# Patient Record
Sex: Male | Born: 1950 | Race: White | Hispanic: No | Smoking: Former smoker
Health system: Southern US, Community
[De-identification: ages and names within clinical notes are randomized; demographics above are authoritative.]

## PROBLEM LIST (undated history)

## (undated) DIAGNOSIS — I2510 Atherosclerotic heart disease of native coronary artery without angina pectoris: Secondary | ICD-10-CM

## (undated) DIAGNOSIS — I10 Essential (primary) hypertension: Secondary | ICD-10-CM

## (undated) DIAGNOSIS — I219 Acute myocardial infarction, unspecified: Secondary | ICD-10-CM

## (undated) DIAGNOSIS — I714 Abdominal aortic aneurysm, without rupture, unspecified: Secondary | ICD-10-CM

## (undated) DIAGNOSIS — E7800 Pure hypercholesterolemia, unspecified: Secondary | ICD-10-CM

## (undated) HISTORY — PX: ABDOMINAL AORTIC ANEURYSM REPAIR: SUR1152

## (undated) HISTORY — PX: CORONARY STENT PLACEMENT: SHX1402

## (undated) HISTORY — PX: ABDOMINAL AORTIC ENDOVASCULAR STENT GRAFT: SHX5707

---

## 2016-01-28 ENCOUNTER — Observation Stay (HOSPITAL_BASED_OUTPATIENT_CLINIC_OR_DEPARTMENT_OTHER)
Admission: EM | Admit: 2016-01-28 | Discharge: 2016-01-30 | Disposition: A | Discharge status: Home / Self Care | Payer: 59 | Attending: Internal Medicine | Admitting: Internal Medicine

## 2016-01-28 ENCOUNTER — Emergency Department (HOSPITAL_BASED_OUTPATIENT_CLINIC_OR_DEPARTMENT_OTHER): Payer: 59

## 2016-01-28 ENCOUNTER — Encounter (HOSPITAL_BASED_OUTPATIENT_CLINIC_OR_DEPARTMENT_OTHER): Payer: Self-pay

## 2016-01-28 DIAGNOSIS — Z7982 Long term (current) use of aspirin: Secondary | ICD-10-CM | POA: Insufficient documentation

## 2016-01-28 DIAGNOSIS — Z87891 Personal history of nicotine dependence: Secondary | ICD-10-CM | POA: Insufficient documentation

## 2016-01-28 DIAGNOSIS — R0789 Other chest pain: Principal | ICD-10-CM | POA: Insufficient documentation

## 2016-01-28 DIAGNOSIS — Z955 Presence of coronary angioplasty implant and graft: Secondary | ICD-10-CM | POA: Insufficient documentation

## 2016-01-28 DIAGNOSIS — I10 Essential (primary) hypertension: Secondary | ICD-10-CM | POA: Diagnosis not present

## 2016-01-28 DIAGNOSIS — R079 Chest pain, unspecified: Secondary | ICD-10-CM | POA: Diagnosis present

## 2016-01-28 DIAGNOSIS — Z79899 Other long term (current) drug therapy: Secondary | ICD-10-CM | POA: Insufficient documentation

## 2016-01-28 DIAGNOSIS — G629 Polyneuropathy, unspecified: Secondary | ICD-10-CM | POA: Insufficient documentation

## 2016-01-28 DIAGNOSIS — I2583 Coronary atherosclerosis due to lipid rich plaque: Secondary | ICD-10-CM | POA: Insufficient documentation

## 2016-01-28 DIAGNOSIS — I2510 Atherosclerotic heart disease of native coronary artery without angina pectoris: Secondary | ICD-10-CM | POA: Diagnosis not present

## 2016-01-28 DIAGNOSIS — K219 Gastro-esophageal reflux disease without esophagitis: Secondary | ICD-10-CM | POA: Diagnosis not present

## 2016-01-28 DIAGNOSIS — I252 Old myocardial infarction: Secondary | ICD-10-CM | POA: Diagnosis not present

## 2016-01-28 DIAGNOSIS — E7800 Pure hypercholesterolemia, unspecified: Secondary | ICD-10-CM | POA: Diagnosis not present

## 2016-01-28 DIAGNOSIS — I200 Unstable angina: Secondary | ICD-10-CM

## 2016-01-28 DIAGNOSIS — I7102 Dissection of abdominal aorta: Secondary | ICD-10-CM

## 2016-01-28 DIAGNOSIS — I714 Abdominal aortic aneurysm, without rupture, unspecified: Secondary | ICD-10-CM

## 2016-01-28 DIAGNOSIS — Z7902 Long term (current) use of antithrombotics/antiplatelets: Secondary | ICD-10-CM | POA: Insufficient documentation

## 2016-01-28 HISTORY — DX: Acute myocardial infarction, unspecified: I21.9

## 2016-01-28 HISTORY — DX: Atherosclerotic heart disease of native coronary artery without angina pectoris: I25.10

## 2016-01-28 HISTORY — DX: Essential (primary) hypertension: I10

## 2016-01-28 HISTORY — DX: Abdominal aortic aneurysm, without rupture, unspecified: I71.40

## 2016-01-28 HISTORY — DX: Pure hypercholesterolemia, unspecified: E78.00

## 2016-01-28 HISTORY — DX: Abdominal aortic aneurysm, without rupture: I71.4

## 2016-01-28 LAB — BASIC METABOLIC PANEL
ANION GAP: 8 (ref 5–15)
BUN: 23 mg/dL — AB (ref 6–20)
CALCIUM: 10 mg/dL (ref 8.9–10.3)
CO2: 27 mmol/L (ref 22–32)
CREATININE: 1.3 mg/dL — AB (ref 0.61–1.24)
Chloride: 106 mmol/L (ref 101–111)
GFR calc Af Amer: >60 mL/min (ref >60)
GFR, EST NON AFRICAN AMERICAN: 57 mL/min — AB (ref >60)
GLUCOSE: 97 mg/dL (ref 65–99)
Potassium: 4.2 mmol/L (ref 3.5–5.1)
Sodium: 141 mmol/L (ref 135–145)

## 2016-01-28 LAB — CBC
HCT: 41.7 % (ref 39.0–52.0)
Hemoglobin: 14.1 g/dL (ref 13.0–17.0)
MCH: 29.5 pg (ref 26.0–34.0)
MCHC: 33.8 g/dL (ref 30.0–36.0)
MCV: 87.2 fL (ref 78.0–100.0)
PLATELETS: 254 10*3/uL (ref 150–400)
RBC: 4.78 MIL/uL (ref 4.22–5.81)
RDW: 13.6 % (ref 11.5–15.5)
WBC: 10.6 10*3/uL — ABNORMAL HIGH (ref 4.0–10.5)

## 2016-01-28 LAB — TROPONIN I

## 2016-01-28 MED ORDER — MORPHINE SULFATE (PF) 4 MG/ML IV SOLN
4.0000 mg | Freq: Once | INTRAVENOUS | Status: AC
  Administered 2016-01-28: 4 mg via INTRAVENOUS
  Filled 2016-01-28: qty 1

## 2016-01-28 MED ORDER — MORPHINE SULFATE (PF) 2 MG/ML IV SOLN
2.0000 mg | Freq: Once | INTRAVENOUS | Status: AC
  Administered 2016-01-28: 2 mg via INTRAVENOUS
  Filled 2016-01-28: qty 1

## 2016-01-28 MED ORDER — NITROGLYCERIN 0.4 MG SL SUBL
0.4000 mg | SUBLINGUAL_TABLET | SUBLINGUAL | Status: DC | PRN
  Administered 2016-01-28 (×2): 0.4 mg via SUBLINGUAL
  Filled 2016-01-28 (×3): qty 1

## 2016-01-28 MED ORDER — IOPAMIDOL (ISOVUE-370) INJECTION 76%
100.0000 mL | Freq: Once | INTRAVENOUS | Status: AC | PRN
  Administered 2016-01-28: 100 mL via INTRAVENOUS

## 2016-01-28 MED ORDER — ASPIRIN 81 MG PO CHEW
324.0000 mg | CHEWABLE_TABLET | Freq: Once | ORAL | Status: AC
Start: 2016-01-28 — End: 2016-01-28
  Administered 2016-01-28: 324 mg via ORAL
  Filled 2016-01-28: qty 4

## 2016-01-28 NOTE — ED Provider Notes (Signed)
[facility] Provider Note   CSN: [id number] Arrival date & time: 01/28/16  4098  First Provider Contact:  First MD Initiated Contact with Patient 01/28/16 1943     By signing my name below, I, [name]. [name], attest that this documentation has been prepared under the direction and in the presence of [name], MD.  Electronically Signed: [name]. [name], ED Scribe. 01/28/16. 8:17 PM.   History   Chief Complaint Chief Complaint  Patient presents with  . Chest Pain   The history is provided by the patient. No language interpreter was used.    HPI Comments: Ronald Mullins is a 65 y.o. male with a PMHx of AAA, CAD, HTN, and MI who presents to the Emergency Department complaining of sudden onset, constant, unchanged R sided chest pain with associated shortness of breath x 1 hour. Pain is described as pressure. Discomfort is exacerbated with movement without any alleviating factors. No OTC medications or home remedies attempted prior to arrival. No Nitro attempted after onset of pain. No recent fever, chills, nausea, or vomiting. PSHx includes abdominal aortic aneurysm repair, abdominal aortic endovascular stent graft, and coronary stent placement.  PCP: No primary care provider on file.    Past Medical History:  Diagnosis Date  . AAA (abdominal aortic aneurysm) without rupture (HCC)   . CAD (coronary artery disease), native coronary artery   . High cholesterol   . Hypertension   . Myocardial infarct (HCC)     There are no active problems to display for this patient.   Past Surgical History:  Procedure Laterality Date  . ABDOMINAL AORTIC ANEURYSM REPAIR    . ABDOMINAL AORTIC ENDOVASCULAR STENT GRAFT    . CORONARY STENT PLACEMENT         Home Medications    Prior to Admission medications   Medication Sig Start Date End Date Taking? Authorizing Provider  UNKNOWN TO PATIENT DOES NOT KNOW HOME MEDS   Yes Historical Provider, MD    Family History No family  history on file.  Social History Social History  Substance Use Topics  . Smoking status: Former Games developer  . Smokeless tobacco: Never Used  . Alcohol use No     Allergies   Review of patient's allergies indicates no known allergies.   Review of Systems Review of Systems  Constitutional: Negative for chills and fever.  Respiratory: Positive for shortness of breath. Negative for cough.   Cardiovascular: Positive for chest pain.  Gastrointestinal: Negative for nausea and vomiting.  Psychiatric/Behavioral: Negative for confusion.  All other systems reviewed and are negative.    Physical Exam Updated Vital Signs BP 169/88 (BP Location: Right Arm)   Pulse (!) 57   Temp 98.7 F (37.1 C) (Oral)   Resp 20   Ht  (1.803 m)   Wt 215 lb (97.5 kg)   SpO2 100%   BMI 29.99 kg/m   Physical Exam  Constitutional: He is oriented to person, place, and time. He appears well-developed and well-nourished. He appears distressed.  Pt appears slightly gray in appearance.  HENT:  Head: Normocephalic and atraumatic.  Eyes: EOM are normal.  Neck: Normal range of motion.  Cardiovascular: Normal rate, regular rhythm, normal heart sounds and intact distal pulses.  Exam reveals no gallop and no friction rub.   No murmur heard. Pulmonary/Chest: Effort normal and breath sounds normal. No respiratory distress. He has no wheezes. He has no rales.  Abdominal: Soft. He exhibits no distension. There is no tenderness.  Musculoskeletal: Normal range of motion.  Neurological: He is alert and oriented to person, place, and time.  Skin: Skin is warm and dry.  Psychiatric: He has a normal mood and affect. Judgment normal.  Nursing note and vitals reviewed.    ED Treatments / Results   DIAGNOSTIC STUDIES: Oxygen Saturation is 100% on RA, Normal by my interpretation.    COORDINATION OF CARE: 8:14 PM- Will order EKG, blood work, and imaging. Will give ASA and Nitro. Discussed treatment plan with pt  at bedside and pt agreed to plan.    Labs (all labs ordered are listed, but only abnormal results are displayed) Labs Reviewed  BASIC METABOLIC PANEL - Abnormal; Notable for the following:       Result Value   BUN 23 (*)    Creatinine, Ser 1.30 (*)    GFR calc non Af Amer 57 (*)    All other components within normal limits  CBC - Abnormal; Notable for the following:    WBC 10.6 (*)    All other components within normal limits  TROPONIN I  TROPONIN I    EKG  EKG Interpretation  Date/Time:  Thursday January 28 2016 20:33:32 EDT Ventricular Rate:  55 PR Interval:    QRS Duration: 100 QT Interval:  439 QTC Calculation: 420 R Axis:   66 Text Interpretation:  Sinus rhythm Normal ECG Confirmed by Corbet Hanley MD, Milliani Herrada [phone]) on 01/28/2016 9:31:36 PM       Radiology Dg Chest 2 View  Result Date: 01/28/2016 CLINICAL DATA:  65 year old male with severe right chest pain. Initial encounter. Former smoker. EXAM: CHEST  2 VIEW COMPARISON:  10/08/2015 and earlier. FINDINGS: Stable tortuosity of the thoracic aorta. Other mediastinal contours are within normal limits. Visualized tracheal air column is within normal limits. No pneumothorax, pulmonary edema, pleural effusion or acute pulmonary opacity. Mild basilar predominant increased interstitial markings are stable and probably related to smoking. Chronic right lateral sixth rib fracture unchanged. Chronic L2 compression fracture is stable since 2015. Partially visible abdominal aortic endograft. IMPRESSION: No acute cardiopulmonary abnormality. Electronically Signed   By: Odessa Fleming M.D.   On: 01/28/2016 20:21   Ct Angio Chest/abd/pel For Dissection W And/or W/wo  Result Date: 01/28/2016 CLINICAL DATA:  65 year old male right chest pain.  History of AAA. EXAM: CT ANGIOGRAPHY CHEST, ABDOMEN AND PELVIS TECHNIQUE: Multidetector CT imaging through the chest, abdomen and pelvis was performed using the standard protocol during bolus administration of  intravenous contrast. Multiplanar reconstructed images and MIPs were obtained and reviewed to evaluate the vascular anatomy. CONTRAST:  100 cc Isovue 370 COMPARISON:  Chest radiograph dated 01/28/2016 and CT of the abdomen pelvis dated 06/22/2014 FINDINGS: CTA CHEST FINDINGS There are minimal bibasilar dependent atelectatic changes of the lungs. There is no focal consolidation, pleural effusion, or pneumothorax. The central airways are patent. There is mild atherosclerotic calcification of the thoracic aorta. There is no dissection. The aortic isthmus measures approximately 3.4 cm in diameter. There is focal wall thickening of the proximal aspect of the left subclavian artery with high grade focal stenosis. The origins of the great vessels of the aortic arch are otherwise patent. There is no CT evidence of pulmonary embolism. There is no cardiomegaly or pericardial effusion. There is coronary vascular calcification. Calcified right hilar and mediastinal lymph nodes. There is no adenopathy. The esophagus is grossly unremarkable. No thyroid nodules identified. There is no axillary adenopathy. The chest wall soft tissues appear unremarkable. Multiple right posterior rib old healed  rib fractures. There are multilevel degenerative changes of the spine. No acute fracture. Review of the MIP images confirms the above findings. CTA ABDOMEN AND PELVIS FINDINGS No intra-abdominal free air or free fluid. There is mild irregularity of the hepatic contour may represent mild underlying cirrhosis. Clinical correlation is recommended. Small gallstones may be present. There is fluid there is fatty infiltration of the uncinate process and proximal pancreas. The spleen, adrenal glands, appear unremarkable. There is no hydronephrosis on either side. Stable appearing bilateral renal cysts measuring up to 3.6 cm on the right. The visualized ureters and urinary bladder appear unremarkable. The prostate and seminal vesicles are grossly  unremarkable. There is sigmoid diverticulosis without active inflammatory changes. There is moderate stool throughout the colon. No evidence of bowel obstruction or active inflammation. Normal appendix. There is stable appearing noncalcified plaque versus scarring along the posterior wall of the abdominal aorta superior to the level of the renal arteries. An aorto bi-iliac endovascular stent graft repair is noted and appears patent. There is no extravasation of contrast outside of the confines of the stent graft to suggest a leak. The origins of the celiac axis, SMA, IMA as well as the origins of the renal arteries appear patent. There is a classic celiac axis branching anatomy. No portal venous gas identified. There is no adenopathy. The abdominal wall soft tissues appear unremarkable. There is osteopenia with degenerative changes of the spine. There is a stable appearing L2 compression deformity. No acute fracture. Review of the MIP images confirms the above findings. IMPRESSION: No CT evidence of aortic dissection or pulmonary embolism. Mildly dilated aortic isthmus measuring 3.4 cm in diameter. Focal high-grade narrowing of the first proximal left subclavian artery. This has progressed compared to the CT dated 08/16/2013. Infrarenal aortobifemoral stent graft repair appears patent with no evidence of endoleak. Electronically Signed   By: Elgie Collard M.D.   On: 01/28/2016 21:33    Procedures Procedures (including critical care time)  Medications Ordered in ED Medications  nitroGLYCERIN (NITROSTAT) SL tablet 0.4 mg (0.4 mg Sublingual Given 01/28/16 2026)  aspirin chewable tablet 324 mg (324 mg Oral Given 01/28/16 2017)  iopamidol (ISOVUE-370) 76 % injection 100 mL (100 mLs Intravenous Contrast Given 01/28/16 2057)  morphine 2 MG/ML injection 2 mg (2 mg Intravenous Given 01/28/16 2238)  morphine 4 MG/ML injection 4 mg (4 mg Intravenous Given 01/28/16 2322)     Initial Impression / Assessment and Plan /  ED Course  I have reviewed the triage vital signs and the nursing notes.  Pertinent labs & imaging results that were available during my care of the patient were reviewed by me and considered in my medical decision making (see chart for details).  Clinical Course    65 year old male with history of coronary artery disease and remote AAA repair presents with right-sided and central chest pressure that radiates into the right side of his chest. He became short of breath with the onset of symptoms. EKG on arrival shows no evidence of active ischemia. First troponin is negative. Given his fall for risk factors and prior aortic pathology a CT of the chest and abdomen was ordered to rule out dissection which is negative. The patient is very high risk for ACS and I am unable to rule out from the emergency department. I recommended admission.  The patient's primary cardiologist is at [facility], I spoke with the hospitalist at [facility] who tells me they have no available beds.  Hospitalist at [facility] Dr  Danford was consulted for admission and accepted the patient in transfer to facility capable of caring for patient further.   Final Clinical Impressions(s) / ED Diagnoses   Final diagnoses:  Chest pain, unspecified chest pain type   I personally performed the services described in this documentation, which was scribed in my presence. The recorded information has been reviewed and is accurate.     New Prescriptions New Prescriptions   No medications on file     [name], MD 01/28/16 [phone]

## 2016-01-28 NOTE — ED Notes (Signed)
Pt transported to CT ?

## 2016-01-28 NOTE — ED Triage Notes (Signed)
CP x 1 hour-cool, clammy to touch-taken to tx area via w/c

## 2016-01-28 NOTE — ED Notes (Signed)
SL Nitro provided little to no relief of pain. Dr. Clydene Pugh notified of pt status and continued pain, but provided no new orders.

## 2016-01-28 NOTE — Progress Notes (Signed)
65 yo M with PVD, CAD and HTN who presents with chest pain.  Sounds typical in character.  Followed by Cardiology [place] at [facility].  Got aspirin, nitro, morphine, no improvement.  Ruled out for dissection with CTA which was negative.  First troponin negative and ECG just sinus bradycardia (no previous available) so no heparin started.  Will go to Stepdown OBS status given ongoing pain.  Serial enzymes are ordered and tele.

## 2016-01-29 ENCOUNTER — Observation Stay (HOSPITAL_COMMUNITY): Payer: 59

## 2016-01-29 ENCOUNTER — Encounter (HOSPITAL_COMMUNITY): Payer: Self-pay | Admitting: Family Medicine

## 2016-01-29 ENCOUNTER — Encounter (HOSPITAL_COMMUNITY)
Admission: EM | Disposition: A | Discharge status: Home / Self Care | Payer: Self-pay | Source: Home / Self Care | Attending: Internal Medicine

## 2016-01-29 DIAGNOSIS — I10 Essential (primary) hypertension: Secondary | ICD-10-CM | POA: Diagnosis not present

## 2016-01-29 DIAGNOSIS — I714 Abdominal aortic aneurysm, without rupture, unspecified: Secondary | ICD-10-CM

## 2016-01-29 DIAGNOSIS — I25110 Atherosclerotic heart disease of native coronary artery with unstable angina pectoris: Secondary | ICD-10-CM

## 2016-01-29 DIAGNOSIS — I209 Angina pectoris, unspecified: Secondary | ICD-10-CM | POA: Diagnosis not present

## 2016-01-29 DIAGNOSIS — I708 Atherosclerosis of other arteries: Secondary | ICD-10-CM | POA: Diagnosis not present

## 2016-01-29 DIAGNOSIS — I2583 Coronary atherosclerosis due to lipid rich plaque: Secondary | ICD-10-CM

## 2016-01-29 DIAGNOSIS — R079 Chest pain, unspecified: Secondary | ICD-10-CM | POA: Diagnosis not present

## 2016-01-29 DIAGNOSIS — I7102 Dissection of abdominal aorta: Secondary | ICD-10-CM

## 2016-01-29 DIAGNOSIS — I2510 Atherosclerotic heart disease of native coronary artery without angina pectoris: Secondary | ICD-10-CM | POA: Diagnosis present

## 2016-01-29 DIAGNOSIS — I200 Unstable angina: Secondary | ICD-10-CM

## 2016-01-29 HISTORY — PX: CARDIAC CATHETERIZATION: SHX172

## 2016-01-29 LAB — BASIC METABOLIC PANEL
ANION GAP: 8 (ref 5–15)
BUN: 16 mg/dL (ref 6–20)
CALCIUM: 9.6 mg/dL (ref 8.9–10.3)
CHLORIDE: 102 mmol/L (ref 101–111)
CO2: 27 mmol/L (ref 22–32)
Creatinine, Ser: 1.18 mg/dL (ref 0.61–1.24)
GFR calc non Af Amer: >60 mL/min (ref >60)
GLUCOSE: 92 mg/dL (ref 65–99)
POTASSIUM: 4 mmol/L (ref 3.5–5.1)
Sodium: 137 mmol/L (ref 135–145)

## 2016-01-29 LAB — PROTIME-INR
INR: 1.17
Prothrombin Time: 15 seconds (ref 11.4–15.2)

## 2016-01-29 LAB — HEPATIC FUNCTION PANEL
ALBUMIN: 3.9 g/dL (ref 3.5–5.0)
ALK PHOS: 47 U/L (ref 38–126)
ALT: 17 U/L (ref 17–63)
AST: 21 U/L (ref 15–41)
BILIRUBIN INDIRECT: 0.4 mg/dL (ref 0.3–0.9)
Bilirubin, Direct: 0.1 mg/dL (ref 0.1–0.5)
TOTAL PROTEIN: 6.4 g/dL — AB (ref 6.5–8.1)
Total Bilirubin: 0.5 mg/dL (ref 0.3–1.2)

## 2016-01-29 LAB — LIPASE, BLOOD: Lipase: 25 U/L (ref 11–51)

## 2016-01-29 LAB — TROPONIN I: Troponin I: <0.03 ng/mL (ref <0.03)

## 2016-01-29 SURGERY — LEFT HEART CATH AND CORONARY ANGIOGRAPHY
Anesthesia: LOCAL

## 2016-01-29 MED ORDER — SODIUM CHLORIDE 0.9% FLUSH
3.0000 mL | Freq: Two times a day (BID) | INTRAVENOUS | Status: DC
  Administered 2016-01-29: 3 mL via INTRAVENOUS

## 2016-01-29 MED ORDER — LISINOPRIL 5 MG PO TABS
5.0000 mg | ORAL_TABLET | Freq: Every day | ORAL | Status: DC
  Administered 2016-01-29 – 2016-01-30 (×2): 5 mg via ORAL
  Filled 2016-01-29 (×2): qty 1

## 2016-01-29 MED ORDER — IOPAMIDOL (ISOVUE-370) INJECTION 76%
INTRAVENOUS | Status: AC
  Filled 2016-01-29: qty 100

## 2016-01-29 MED ORDER — MORPHINE SULFATE (PF) 2 MG/ML IV SOLN: 2.0000 mg | INTRAVENOUS | Status: DC | PRN

## 2016-01-29 MED ORDER — HEPARIN SODIUM (PORCINE) 1000 UNIT/ML IJ SOLN
INTRAMUSCULAR | Status: DC | PRN
  Administered 2016-01-29: 5000 [IU] via INTRAVENOUS

## 2016-01-29 MED ORDER — MORPHINE SULFATE (PF) 4 MG/ML IV SOLN
4.0000 mg | Freq: Once | INTRAVENOUS | Status: AC
  Administered 2016-01-29: 4 mg via INTRAVENOUS

## 2016-01-29 MED ORDER — SODIUM CHLORIDE 0.9% FLUSH
3.0000 mL | Freq: Two times a day (BID) | INTRAVENOUS | Status: DC
  Administered 2016-01-30: 3 mL via INTRAVENOUS

## 2016-01-29 MED ORDER — HEPARIN (PORCINE) IN NACL 2-0.9 UNIT/ML-% IJ SOLN
INTRAMUSCULAR | Status: DC | PRN
  Administered 2016-01-29: 1000 mL

## 2016-01-29 MED ORDER — SODIUM CHLORIDE 0.9 % IV SOLN: 250.0000 mL | INTRAVENOUS | Status: DC | PRN

## 2016-01-29 MED ORDER — FENTANYL CITRATE (PF) 100 MCG/2ML IJ SOLN
INTRAMUSCULAR | Status: DC | PRN
  Administered 2016-01-29: 50 ug via INTRAVENOUS

## 2016-01-29 MED ORDER — ACETAMINOPHEN 325 MG PO TABS
650.0000 mg | ORAL_TABLET | ORAL | Status: DC | PRN
Start: 2016-01-29 — End: 2016-01-30

## 2016-01-29 MED ORDER — GABAPENTIN 300 MG PO CAPS
300.0000 mg | ORAL_CAPSULE | Freq: Two times a day (BID) | ORAL | Status: DC
  Administered 2016-01-29 – 2016-01-30 (×3): 300 mg via ORAL
  Filled 2016-01-29 (×3): qty 1

## 2016-01-29 MED ORDER — MORPHINE SULFATE (PF) 4 MG/ML IV SOLN
INTRAVENOUS | Status: AC
  Filled 2016-01-29: qty 1

## 2016-01-29 MED ORDER — CARVEDILOL 3.125 MG PO TABS
3.1250 mg | ORAL_TABLET | Freq: Two times a day (BID) | ORAL | Status: DC
  Administered 2016-01-29 – 2016-01-30 (×2): 3.125 mg via ORAL
  Filled 2016-01-29 (×3): qty 1

## 2016-01-29 MED ORDER — VERAPAMIL HCL 2.5 MG/ML IV SOLN
INTRAVENOUS | Status: DC | PRN
  Administered 2016-01-29: 10 mL via INTRA_ARTERIAL

## 2016-01-29 MED ORDER — SODIUM CHLORIDE 0.9% FLUSH: 3.0000 mL | INTRAVENOUS | Status: DC | PRN

## 2016-01-29 MED ORDER — LIDOCAINE HCL (PF) 1 % IJ SOLN
INTRAMUSCULAR | Status: AC
  Filled 2016-01-29: qty 30

## 2016-01-29 MED ORDER — MIDAZOLAM HCL 2 MG/2ML IJ SOLN
INTRAMUSCULAR | Status: AC
  Filled 2016-01-29: qty 2

## 2016-01-29 MED ORDER — ONDANSETRON HCL 4 MG/2ML IJ SOLN: 4.0000 mg | Freq: Four times a day (QID) | INTRAMUSCULAR | Status: DC | PRN

## 2016-01-29 MED ORDER — ASPIRIN EC 81 MG PO TBEC
81.0000 mg | DELAYED_RELEASE_TABLET | Freq: Every day | ORAL | Status: DC
  Administered 2016-01-29 – 2016-01-30 (×2): 81 mg via ORAL
  Filled 2016-01-29 (×2): qty 1

## 2016-01-29 MED ORDER — LIDOCAINE HCL (PF) 1 % IJ SOLN
INTRAMUSCULAR | Status: DC | PRN
  Administered 2016-01-29: 2 mL via INTRADERMAL

## 2016-01-29 MED ORDER — MIDAZOLAM HCL 2 MG/2ML IJ SOLN
INTRAMUSCULAR | Status: DC | PRN
  Administered 2016-01-29: 1 mg via INTRAVENOUS

## 2016-01-29 MED ORDER — ENOXAPARIN SODIUM 40 MG/0.4ML ~~LOC~~ SOLN
40.0000 mg | SUBCUTANEOUS | Status: DC
  Administered 2016-01-29: 40 mg via SUBCUTANEOUS
  Filled 2016-01-29 (×2): qty 0.4

## 2016-01-29 MED ORDER — GI COCKTAIL ~~LOC~~: 30.0000 mL | Freq: Four times a day (QID) | ORAL | Status: DC | PRN

## 2016-01-29 MED ORDER — HEPARIN (PORCINE) IN NACL 2-0.9 UNIT/ML-% IJ SOLN
INTRAMUSCULAR | Status: AC
  Filled 2016-01-29: qty 1000

## 2016-01-29 MED ORDER — SODIUM CHLORIDE 0.9 % WEIGHT BASED INFUSION: 1.0000 mL/kg/h | INTRAVENOUS | Status: AC

## 2016-01-29 MED ORDER — HEPARIN SODIUM (PORCINE) 1000 UNIT/ML IJ SOLN
INTRAMUSCULAR | Status: AC
  Filled 2016-01-29: qty 1

## 2016-01-29 MED ORDER — ATORVASTATIN CALCIUM 80 MG PO TABS
80.0000 mg | ORAL_TABLET | Freq: Every day | ORAL | Status: DC
  Administered 2016-01-29: 80 mg via ORAL
  Filled 2016-01-29: qty 1

## 2016-01-29 MED ORDER — ACETAMINOPHEN 325 MG PO TABS: 650.0000 mg | ORAL_TABLET | ORAL | Status: DC | PRN

## 2016-01-29 MED ORDER — IOPAMIDOL (ISOVUE-370) INJECTION 76%
INTRAVENOUS | Status: DC | PRN
  Administered 2016-01-29: 70 mL via INTRA_ARTERIAL

## 2016-01-29 MED ORDER — ASPIRIN 81 MG PO CHEW: 81.0000 mg | CHEWABLE_TABLET | ORAL | Status: DC

## 2016-01-29 MED ORDER — PANTOPRAZOLE SODIUM 40 MG PO TBEC
40.0000 mg | DELAYED_RELEASE_TABLET | Freq: Every day | ORAL | Status: DC
  Administered 2016-01-29 – 2016-01-30 (×2): 40 mg via ORAL
  Filled 2016-01-29 (×2): qty 1

## 2016-01-29 MED ORDER — FENTANYL CITRATE (PF) 100 MCG/2ML IJ SOLN
INTRAMUSCULAR | Status: AC
  Filled 2016-01-29: qty 2

## 2016-01-29 MED ORDER — PRASUGREL HCL 10 MG PO TABS
10.0000 mg | ORAL_TABLET | Freq: Every day | ORAL | Status: DC
  Administered 2016-01-29 – 2016-01-30 (×2): 10 mg via ORAL
  Filled 2016-01-29 (×2): qty 1

## 2016-01-29 MED ORDER — SODIUM CHLORIDE 0.9 % IV SOLN
INTRAVENOUS | Status: DC
  Administered 2016-01-29: 13:00:00 via INTRAVENOUS

## 2016-01-29 SURGICAL SUPPLY — 9 items
CATH INFINITI 5 FR JL3.5 (CATHETERS) ×2 IMPLANT
CATH INFINITI JR4 5F (CATHETERS) ×2 IMPLANT
DEVICE RAD COMP TR BAND LRG (VASCULAR PRODUCTS) ×2 IMPLANT
GLIDESHEATH SLEND SS 6F .021 (SHEATH) ×2 IMPLANT
KIT HEART LEFT (KITS) ×2 IMPLANT
PACK CARDIAC CATHETERIZATION (CUSTOM PROCEDURE TRAY) ×2 IMPLANT
TRANSDUCER W/STOPCOCK (MISCELLANEOUS) ×2 IMPLANT
TUBING CIL FLEX 10 FLL-RA (TUBING) ×2 IMPLANT
WIRE SAFE-T 1.5MM-J .035X260CM (WIRE) ×2 IMPLANT

## 2016-01-29 NOTE — Progress Notes (Signed)
Pt back of floor from cath lab, VSS, right radial site level 0, alert and oriented, tolerating liquids, will continue to monitor closely.  Raymon Mutton RN

## 2016-01-29 NOTE — Progress Notes (Signed)
[facility]                                                                              Patient Demographics  Ronald Mullins, is a 65 y.o. male, DOB - [birth year], NWG:[id number]  Admit date - 01/28/2016   Admitting Physician Alberteen Sam, MD  Outpatient Primary MD for the patient is Everrett Coombe, DO  Outpatient specialists:   LOS - 1  days    Chief Complaint  Patient presents with  . Chest Pain       Brief summary  Ronald Mullins is a 65 y.o. male with a past medical history significant for AAA s/p rupture in 2015 and endo-repair, STEMI in Oct 2016 s/p DES (to LCx-OM1 bifurcation) and then staged DES to residual D1 in 09/2015 who presents with chest pain. 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  This was vague and pressure-like and similar to his previous angina, and associated with some panicking, sweating.  He sat down and it didn't go away, and then he had onset of sharp, severe, brief shooting/stabbing pains in his right chest like his aneurysm rupture and so he went to the ER. ED course: -Afebrile, hemodynamically stable, hypertensive -Initial ECG showed normal sinus rhythm without ST changes and troponin was negative. -Na 141, K 4.2, Cr 1.3 (baseline 1.2), WBC 10.6, Hgb 14.1 -A CTA chest abdomen and pelvis dissection study was done that showed no change in his chronic endovascularly repaired aneurysm, no gallbladder disease, no pancreatic disease, no PE, pneumothorax or pneumonia -He was given morphine and nitro and his pain eased slightly and [facility] were asked to evaluate for admission because [facility] where his primary cardiologist is was full -[facility] was asked to admit for observation, serial troponins and risk stratification. The pain started abruptly, was not  food related or related to movement.  No recent heavy lifting or arm movement.    Assessment & Plan    Chest pain:With strong history of coronary artery disease, per patient PCI in October 2016 and in April 2017 - Cardiology consulted, recommending cardiac cath  - Serial cardiac enzymes negative so far  - Continue aspirin, Effient, statin, beta blocker, lisinopril - Cardiac enzymes negative so far, EKG with no acute ST-T wave changes suggestive of ischemia - CT angiogram of the chest/abdomen showed no aortic dissection or PE  CAD:  -Continue aspirin, Effient -Continue statin -Continue BB and ACEi  Neuropathy:  -Continue gabapentin  Code Status: full code DVT Prophylaxis:  Lovenox Family Communication: Discussed in detail with the patient, all imaging results, lab results explained to the patient  Disposition Plan:   Time Spent in minutes    Procedures:  CTAchest abdomen  Consultants:   Cardiology  Antimicrobials:      Medications  Scheduled Meds: . aspirin EC  81 mg Oral Daily  . atorvastatin  80 mg Oral q1800  . carvedilol  3.125 mg Oral BID WC  .  enoxaparin (LOVENOX) injection  40 mg Subcutaneous Q24H  . gabapentin  300 mg Oral BID  . lisinopril  5 mg Oral Daily  . pantoprazole  40 mg Oral Q0600  . prasugrel  10 mg Oral Daily   Continuous Infusions:  PRN Meds:.acetaminophen, gi cocktail, morphine injection, nitroGLYCERIN, ondansetron (ZOFRAN) IV   Antibiotics   Anti-infectives    None        Subjective:   Ronald Mullins was seen and examined today. Still complaining of chest pain 5/10 intermittent, left-sided. No radiation, no left arm heaviness or numbness or tingling or palpitations or dyspnea. No fevers.  Objective:   Vitals:   01/29/16 0300 01/29/16 0305 01/29/16 0330 01/29/16 0421  BP: 143/72  147/75 (!) 157/87  Pulse: (!) 55  63 (!) 53  Resp: 13  16 17   Temp:  98.1 F (36.7 C)  98.2 F (36.8 C)  TempSrc:  Oral  Oral    SpO2: 96%  99% 97%  Weight:    95.4 kg (210 lb 4.8 oz)  Height:    5\' 11"  (1.803 m)    Intake/Output Summary (Last 24 hours) at 01/29/16 1140 Last data filed at 01/29/16 0906  Gross per 24 hour  Intake                0 ml  Output                0 ml  Net                0 ml     Wt Readings from Last 3 Encounters:  01/29/16 95.4 kg (210 lb 4.8 oz)     Exam  General: Alert and oriented x 3, NAD  HEENT:  PERRLA, EOMI, Anicteric Sclera, mucous membranes moist.   Neck: Supple, no JVD, no masses  Cardiovascular: S1 S2 auscultated, no rubs, murmurs or gallops. Regular rate and rhythm.  Respiratory: Clear to auscultation bilaterally, no wheezing, rales or rhonchi, no chest wall tenderness  Gastrointestinal: Soft, nontender, nondistended, + bowel sounds  Ext: no cyanosis clubbing or edema  Neuro: AAOx3, Cr N's II- XII. Strength 5/5 upper and lower extremities bilaterally  Skin: No rashes  Psych: Normal affect and demeanor, alert and oriented x3    Data Reviewed:  I have personally reviewed following labs and imaging studies  Micro Results No results found for this or any previous visit (from the past 240 hour(s)).  Radiology Reports Dg Chest 2 View  Result Date: 01/28/2016 CLINICAL DATA:  65 year old male with severe right chest pain. Initial encounter. Former smoker. EXAM: CHEST  2 VIEW COMPARISON:  10/08/2015 and earlier. FINDINGS: Stable tortuosity of the thoracic aorta. Other mediastinal contours are within normal limits. Visualized tracheal air column is within normal limits. No pneumothorax, pulmonary edema, pleural effusion or acute pulmonary opacity. Mild basilar predominant increased interstitial markings are stable and probably related to smoking. Chronic right lateral sixth rib fracture unchanged. Chronic L2 compression fracture is stable since 2015. Partially visible abdominal aortic endograft. IMPRESSION: No acute cardiopulmonary abnormality. Electronically  Signed   By: Odessa Fleming M.D.   On: 01/28/2016 20:21   Ct Angio Chest/abd/pel For Dissection W And/or W/wo  Result Date: 01/28/2016 CLINICAL DATA:  65 year old male right chest pain.  History of AAA. EXAM: CT ANGIOGRAPHY CHEST, ABDOMEN AND PELVIS TECHNIQUE: Multidetector CT imaging through the chest, abdomen and pelvis was performed using the standard protocol during bolus administration of intravenous contrast. Multiplanar reconstructed images and MIPs  were obtained and reviewed to evaluate the vascular anatomy. CONTRAST:  100 cc Isovue 370 COMPARISON:  Chest radiograph dated 01/28/2016 and CT of the abdomen pelvis dated 06/22/2014 FINDINGS: CTA CHEST FINDINGS There are minimal bibasilar dependent atelectatic changes of the lungs. There is no focal consolidation, pleural effusion, or pneumothorax. The central airways are patent. There is mild atherosclerotic calcification of the thoracic aorta. There is no dissection. The aortic isthmus measures approximately 3.4 cm in diameter. There is focal wall thickening of the proximal aspect of the left subclavian artery with high grade focal stenosis. The origins of the great vessels of the aortic arch are otherwise patent. There is no CT evidence of pulmonary embolism. There is no cardiomegaly or pericardial effusion. There is coronary vascular calcification. Calcified right hilar and mediastinal lymph nodes. There is no adenopathy. The esophagus is grossly unremarkable. No thyroid nodules identified. There is no axillary adenopathy. The chest wall soft tissues appear unremarkable. Multiple right posterior rib old healed rib fractures. There are multilevel degenerative changes of the spine. No acute fracture. Review of the MIP images confirms the above findings. CTA ABDOMEN AND PELVIS FINDINGS No intra-abdominal free air or free fluid. There is mild irregularity of the hepatic contour may represent mild underlying cirrhosis. Clinical correlation is recommended. Small  gallstones may be present. There is fluid there is fatty infiltration of the uncinate process and proximal pancreas. The spleen, adrenal glands, appear unremarkable. There is no hydronephrosis on either side. Stable appearing bilateral renal cysts measuring up to 3.6 cm on the right. The visualized ureters and urinary bladder appear unremarkable. The prostate and seminal vesicles are grossly unremarkable. There is sigmoid diverticulosis without active inflammatory changes. There is moderate stool throughout the colon. No evidence of bowel obstruction or active inflammation. Normal appendix. There is stable appearing noncalcified plaque versus scarring along the posterior wall of the abdominal aorta superior to the level of the renal arteries. An aorto bi-iliac endovascular stent graft repair is noted and appears patent. There is no extravasation of contrast outside of the confines of the stent graft to suggest a leak. The origins of the celiac axis, SMA, IMA as well as the origins of the renal arteries appear patent. There is a classic celiac axis branching anatomy. No portal venous gas identified. There is no adenopathy. The abdominal wall soft tissues appear unremarkable. There is osteopenia with degenerative changes of the spine. There is a stable appearing L2 compression deformity. No acute fracture. Review of the MIP images confirms the above findings. IMPRESSION: No CT evidence of aortic dissection or pulmonary embolism. Mildly dilated aortic isthmus measuring 3.4 cm in diameter. Focal high-grade narrowing of the first proximal left subclavian artery. This has progressed compared to the CT dated 08/16/2013. Infrarenal aortobifemoral stent graft repair appears patent with no evidence of endoleak. Electronically Signed   By: Elgie Collard M.D.   On: 01/28/2016 21:33    Lab Data:  CBC:  Recent Labs Lab 01/28/16 1935  WBC 10.6*  HGB 14.1  HCT 41.7  MCV 87.2  PLT 254   Basic Metabolic  Panel:  Recent Labs Lab 01/28/16 1935  NA 141  K 4.2  CL 106  CO2 27  GLUCOSE 97  BUN 23*  CREATININE 1.30*  CALCIUM 10.0   GFR: Estimated Creatinine Clearance: 68.5 mL/min (by C-G formula based on SCr of 1.3 mg/dL). Liver Function Tests:  Recent Labs Lab 01/29/16 0546  AST 21  ALT 17  ALKPHOS 47  BILITOT 0.5  PROT 6.4*  ALBUMIN 3.9    Recent Labs Lab 01/29/16 0546  LIPASE 25   No results for input(s): AMMONIA in the last 168 hours. Coagulation Profile: No results for input(s): INR, PROTIME in the last 168 hours. Cardiac Enzymes:  Recent Labs Lab 01/28/16 1935 01/28/16 2345 01/29/16 0546  TROPONINI <0.03 <0.03 <0.03   BNP (last 3 results) No results for input(s): PROBNP in the last 8760 hours. HbA1C: No results for input(s): HGBA1C in the last 72 hours. CBG: No results for input(s): GLUCAP in the last 168 hours. Lipid Profile: No results for input(s): CHOL, HDL, LDLCALC, TRIG, CHOLHDL, LDLDIRECT in the last 72 hours. Thyroid Function Tests: No results for input(s): TSH, T4TOTAL, FREET4, T3FREE, THYROIDAB in the last 72 hours. Anemia Panel: No results for input(s): VITAMINB12, FOLATE, FERRITIN, TIBC, IRON, RETICCTPCT in the last 72 hours. Urine analysis: No results found for: COLORURINE, APPEARANCEUR, LABSPEC, PHURINE, GLUCOSEU, HGBUR, BILIRUBINUR, KETONESUR, PROTEINUR, UROBILINOGEN, NITRITE, Hurshel Party M.D. [facility] 01/29/2016, 11:40 AM  Pager: [phone] Between 7am to 7pm - call Pager - [phone]  After 7pm go to [email or web address] - password [email or web address]  Call night coverage person covering after 7pm

## 2016-01-29 NOTE — H&P (Signed)
History and Physical  Patient Name: Ronald Mullins     SMO:[id number]    DOB: 1951-02-09    DOA: 01/28/2016 PCP: Everrett Coombe, DO   Patient coming from: Home     Chief Complaint: Chest pain  HPI: Ronald Mullins is a 65 y.o. male with a past medical history significant for AAA s/p rupture in 2015 and endo-repair, STEMI in Oct 2016 s/p DES (to LCx-OM1 bifurcation) and then staged DES to residual D1 in 09/2015 who presents with chest pain.  The patient has been pain-free since his STEMI last October until tonight he was on the baseball field (he is president of a baseball league in [facility]) and had sudden onset of substernal chest discomfort while just standing there.  This was vague and pressure-like and similar to his previous angina, and associated with some panicking, sweating.  He sat down and it didn't go away, and then he had onset of sharp, severe, brief shooting/stabbing pains in his right chest like his aneurysm rupture and so he went to the ER.  ED course: -Afebrile, hemodynamically stable, hypertensive -Initial ECG showed normal sinus rhythm without ST changes and troponin was negative. -Na 141, K 4.2, Cr 1.3 (baseline 1.2), WBC 10.6, Hgb 14.1 -A CTA chest abdomen and pelvis dissection study was done that showed no change in his chronic endovascularly repaired aneurysm, no gallbladder disease, no pancreatic disease, no PE, pneumothorax or pneumonia -He was given morphine and nitro and his pain eased slightly and [facility] were asked to evaluate for admission because [facility] where his primary cardiologist is was full -TRH was asked to admit for observation, serial troponins and risk stratification.  The pain started abruptly, was not food related or related to movement.  No recent heavy lifting or arm movement.     Review of Systems:  All other systems negative except as just noted or noted in the history of present illness.  Past Medical History:  Diagnosis Date  . AAA  (abdominal aortic aneurysm) without rupture (HCC)   . CAD (coronary artery disease), native coronary artery   . High cholesterol   . Hypertension   . Myocardial infarct [facility])     Past Surgical History:  Procedure Laterality Date  . ABDOMINAL AORTIC ANEURYSM REPAIR    . ABDOMINAL AORTIC ENDOVASCULAR STENT GRAFT    . CORONARY STENT PLACEMENT      Social History: Patient lives with his son.  Patient walks unassisted.  He is a remote former smoker.  He does not drink.  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    No Known Allergies  Family history: family history includes Celiac disease in his sister; Diabetes in his mother; Hypertension in his father and mother.  Prior to Admission medications   Medication Sig Start Date End Date Taking? Authorizing Provider  aspirin 81 MG tablet Take 81 mg by mouth daily.   Yes Historical Provider, MD  atorvastatin (LIPITOR) 80 MG tablet Take 80 mg by mouth daily at 6 PM.   Yes Historical Provider, MD  carvedilol (COREG) 3.125 MG tablet Take 3.125 mg by mouth 2 (two) times daily with a meal.   Yes Historical Provider, MD  gabapentin (NEURONTIN) 300 MG capsule Take 300 mg by mouth 2 (two) times daily.   Yes Historical Provider, MD  lisinopril (PRINIVIL,ZESTRIL) 5 MG tablet Take 5 mg by mouth daily.   Yes Historical Provider, MD  Prasugrel HCl (EFFIENT PO) Take 10 mg by mouth daily.  Yes Historical Provider, MD       Physical Exam: BP (!) 157/87 (BP Location: Left Arm)   Pulse (!) 53   Temp 98.2 F (36.8 C) (Oral)   Resp 17   Ht  (1.803 m)   Wt 95.4 kg (210 lb 4.8 oz)   SpO2 97%   BMI 29.33 kg/m  General appearance: Well-developed, adult male, alert and in no acute distress.   Eyes: Anicteric, conjunctiva pink, lids and lashes normal.     ENT: No nasal deformity, discharge, or epistaxis.  OP moist without lesions.   Skin: Warm and dry.   Cardiac: RRR, nl S1-S2, no murmurs appreciated.  Capillary refill is brisk.  JVP normal.  No  LE edema.  Radial pulses 2+ and symmetric.  DP pulses normal.  No carotid bruits. Respiratory: Normal respiratory rate and rhythm.  CTAB without rales or wheezes. GI: Abdomen soft without rigidity.  No TTP. No ascites, distension.   MSK: No deformities or effusions.   Pain not reproduced with palpation of precordium.  There is pain with arm movement. Neuro: Sensorium intact and responding to questions, attention normal.  Speech is fluent.  Moves all extremities equally and with normal coordination.    Psych: Behavior appropriate.  Affect normal.  No evidence of aural or visual hallucinations or delusions.       Labs on Admission:  The metabolic panel shows normal electrolytes and renal function. The complete blood count shows normal WBC, hemoglobin, and platelets. The initial troponin is negative.  Radiological Exams on Admission: Personally reviewed: Dg Chest 2 View  Result Date: 01/28/2016 CLINICAL DATA:  65 year old male with severe right chest pain. Initial encounter. Former smoker. EXAM: CHEST  2 VIEW COMPARISON:  10/08/2015 and earlier. FINDINGS: Stable tortuosity of the thoracic aorta. Other mediastinal contours are within normal limits. Visualized tracheal air column is within normal limits. No pneumothorax, pulmonary edema, pleural effusion or acute pulmonary opacity. Mild basilar predominant increased interstitial markings are stable and probably related to smoking. Chronic right lateral sixth rib fracture unchanged. Chronic L2 compression fracture is stable since 2015. Partially visible abdominal aortic endograft. IMPRESSION: No acute cardiopulmonary abnormality. Electronically Signed   By: Odessa Fleming M.D.   On: 01/28/2016 20:21   Ct Angio Chest/abd/pel For Dissection W And/or W/wo  Result Date: 01/28/2016 CLINICAL DATA:  65 year old male right chest pain.  History of AAA. EXAM: CT ANGIOGRAPHY CHEST, ABDOMEN AND PELVIS TECHNIQUE: Multidetector CT imaging through the chest, abdomen and  pelvis was performed using the standard protocol during bolus administration of intravenous contrast. Multiplanar reconstructed images and MIPs were obtained and reviewed to evaluate the vascular anatomy. CONTRAST:  100 cc Isovue 370 COMPARISON:  Chest radiograph dated 01/28/2016 and CT of the abdomen pelvis dated 06/22/2014 FINDINGS: CTA CHEST FINDINGS There are minimal bibasilar dependent atelectatic changes of the lungs. There is no focal consolidation, pleural effusion, or pneumothorax. The central airways are patent. There is mild atherosclerotic calcification of the thoracic aorta. There is no dissection. The aortic isthmus measures approximately 3.4 cm in diameter. There is focal wall thickening of the proximal aspect of the left subclavian artery with high grade focal stenosis. The origins of the great vessels of the aortic arch are otherwise patent. There is no CT evidence of pulmonary embolism. There is no cardiomegaly or pericardial effusion. There is coronary vascular calcification. Calcified right hilar and mediastinal lymph nodes. There is no adenopathy. The esophagus is grossly unremarkable. No thyroid nodules identified. There is no  axillary adenopathy. The chest wall soft tissues appear unremarkable. Multiple right posterior rib old healed rib fractures. There are multilevel degenerative changes of the spine. No acute fracture. Review of the MIP images confirms the above findings. CTA ABDOMEN AND PELVIS FINDINGS No intra-abdominal free air or free fluid. There is mild irregularity of the hepatic contour may represent mild underlying cirrhosis. Clinical correlation is recommended. Small gallstones may be present. There is fluid there is fatty infiltration of the uncinate process and proximal pancreas. The spleen, adrenal glands, appear unremarkable. There is no hydronephrosis on either side. Stable appearing bilateral renal cysts measuring up to 3.6 cm on the right. The visualized ureters and urinary  bladder appear unremarkable. The prostate and seminal vesicles are grossly unremarkable. There is sigmoid diverticulosis without active inflammatory changes. There is moderate stool throughout the colon. No evidence of bowel obstruction or active inflammation. Normal appendix. There is stable appearing noncalcified plaque versus scarring along the posterior wall of the abdominal aorta superior to the level of the renal arteries. An aorto bi-iliac endovascular stent graft repair is noted and appears patent. There is no extravasation of contrast outside of the confines of the stent graft to suggest a leak. The origins of the celiac axis, SMA, IMA as well as the origins of the renal arteries appear patent. There is a classic celiac axis branching anatomy. No portal venous gas identified. There is no adenopathy. The abdominal wall soft tissues appear unremarkable. There is osteopenia with degenerative changes of the spine. There is a stable appearing L2 compression deformity. No acute fracture. Review of the MIP images confirms the above findings. IMPRESSION: No CT evidence of aortic dissection or pulmonary embolism. Mildly dilated aortic isthmus measuring 3.4 cm in diameter. Focal high-grade narrowing of the first proximal left subclavian artery. This has progressed compared to the CT dated 08/16/2013. Infrarenal aortobifemoral stent graft repair appears patent with no evidence of endoleak. Electronically Signed   By: Elgie Collard M.D.   On: 01/28/2016 21:33    EKG: Independently reviewed. Rate 60, QTc normal.  Sinus rhythm, no ST changes.    Assessment/Plan 1. Chest pain: This is new.  He has a substernal chest pain that is typical of his 80s angina from STEMI last fall. He is also somewhat preoccupied by stabbing atypical right-sided chest pain which has no correlate on CT angiogram. Other potential causes of chest pain (PE, dissection, pancreatitis, pneumonia/effusion, pericarditis) are doubted at this  time.  We have been asked to admit the patient for observation and etiology consultation with Cardiology tomorrow.  -Serial troponins are ordered -Telemetry -Consult to cardiology, appreciate recommendations -Check lipase and LFTs   2. CAD:  -Continue aspirin, Effient -Continue statin -Continue BB and ACEi  3. Neuropathy:  -Continue gabapentin     DVT prophylaxis: None, outpatient status Diet: NPO  Code Status: Full  Family Communication: None present  Disposition Plan: Anticipate overnight observation for arrhythmia on telemetry, serial troponins and subsequent risk stratification by Cardiology.  If testing negative, home after. Consults called: Cardiology Admission status: Telemetry, OBS   Medical decision making: Patient seen at 5:01 AM on 01/29/2016.  The patient was discussed with Dr. Clydene Pugh. What exists of the patient's chart from outside records in [facility] was reviewed in depth.  Clinical condition: stable.      Alberteen Sam [facility] Pager [phone]

## 2016-01-29 NOTE — ED Notes (Addendum)
New "Level of Care" (telemetry) received from Dr. Maryfrances Bunnell.  [facility] and EDP Dr. Judd Lien aware. Pending bed assignment.

## 2016-01-29 NOTE — Consult Note (Signed)
 CARDIOLOGY CONSULT NOTE   Patient ID: Keanthony Nigh MRN: [id number] DOB/AGE: 08/26/1950 64 y.o.  Admit date: 01/28/2016  Primary Physician   Matthews, Cody, DO Primary Cardiologist   Renaldo, Gary J, MD  [address]  [place], [place]  Vascular surgery: Motew, Stephen J, MD  Reason for Consultation   Chest pain  Requesting Physician  Dr. Rai  HPI: Rashaan Mullins is a 64 y.o. male with a history of AAA s/p endovascular stent graft, CAD, MI, HLD, HTN who presented with CP.   Seen by Dr. Motew 10/19/15 for AAA follow up. Per note his most current CT scan shows significant shrinkage of his sac measuring approximately 3.2 cm. The rest of his graft and examination is normal.  Hx of CAD with inferior posterior MI in October 2016 at [facility]. He received drug-eluting stents to the circumflex and obtuse marginal branch and at that time an echo revealed an LVEF of 45-50% with inferior lateral hypokinesis and no significant valvular heart disease. He electively came back in April 2017 to have a drug-eluting stent placed to a diagonal branch. He does have a residual 50% LAD disease but only mild disease elsewhere in his stents were patent. According to the cardiac catheterization report, his LV function was normal in April.  He was doing well on cardiac stand point when seen last by Dr. Renaldo 12/09/15.   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 This pain is similar to prior MI and last for about 30 mins, did not resolved then he had right sided sharp chest pain that felt like AAA rapture. Not relieved with rest. Associated with SOB. The pain persisted and brought to ER for further evaluation. No improvement on nitro. Eased with morphine. EKG showed NSR. No acute changes. Troponin x 3 negative. Scr of 1.3. CXR clear.  CT angio of chest/abdomen/pelvis showed:  IMPRESSION: No CT  evidence of aortic dissection or pulmonary embolism. Mildly dilated aortic isthmus measuring 3.4 cm in diameter.  Focal high-grade narrowing of the first proximal left subclavian artery. This has progressed compared to the CT dated 08/16/2013.  Infrarenal aortobifemoral stent graft repair appears patent with no evidence of endoleak.   Currently having chest discomfort.    Past Medical History:  Diagnosis Date  . AAA (abdominal aortic aneurysm) without rupture (HCC)   . CAD (coronary artery disease), native coronary artery   . High cholesterol   . Hypertension   . Myocardial infarct (HCC)      Past Surgical History:  Procedure Laterality Date  . ABDOMINAL AORTIC ANEURYSM REPAIR    . ABDOMINAL AORTIC ENDOVASCULAR STENT GRAFT    . CORONARY STENT PLACEMENT      No Known Allergies  I have reviewed the patient's current medications . aspirin EC  81 mg Oral Daily  . atorvastatin  80 mg Oral q1800  . carvedilol  3.125 mg Oral BID WC  . enoxaparin (LOVENOX) injection  40 mg Subcutaneous Q24H  . gabapentin  300 mg Oral BID  . lisinopril  5 mg Oral Daily  . prasugrel  10 mg Oral Daily     acetaminophen, gi cocktail, morphine injection, nitroGLYCERIN, ondansetron (ZOFRAN) IV  Prior to Admission medications   Medication Sig Start Date End Date Taking? Authorizing Provider  aspirin 81 MG tablet Take 81 mg by mouth daily.   Yes Historical Provider, MD  atorvastatin (LIPITOR) 80 MG   tablet Take 80 mg by mouth daily at 6 PM.   Yes Historical Provider, MD  carvedilol (COREG) 3.125 MG tablet Take 3.125 mg by mouth 2 (two) times daily with a meal.   Yes Historical Provider, MD  gabapentin (NEURONTIN) 300 MG capsule Take 300 mg by mouth 2 (two) times daily.   Yes Historical Provider, MD  lisinopril (PRINIVIL,ZESTRIL) 5 MG tablet Take 5 mg by mouth daily.   Yes Historical Provider, MD  Prasugrel HCl (EFFIENT PO) Take 10 mg by mouth daily.    Yes Historical Provider, MD     Social  History   Social History  . Marital status: Divorced    Spouse name: N/A  . Number of children: N/A  . Years of education: N/A   Occupational History  . Not on file.   Social History Main Topics  . Smoking status: Former Smoker  . Smokeless tobacco: Never Used  . Alcohol use No  . Drug use: No  . Sexual activity: Not on file   Other Topics Concern  . Not on file   Social History Narrative  . No narrative on file    Family Status  Relation Status  . Mother   . Father Deceased  . Sister    Family History  Problem Relation Age of Onset  . Hypertension Mother   . Diabetes Mother   . Hypertension Father     Deceased after valve surgery  . Celiac disease Sister       ROS:  Full 14 point review of systems complete and found to be negative unless listed above.  Physical Exam: Blood pressure (!) 157/87, pulse (!) 53, temperature 98.2 F (36.8 C), temperature source Oral, resp. rate 17, height 5' 11" (1.803 m), weight 210 lb 4.8 oz (95.4 kg), SpO2 97 %.  General: Well developed, well nourished, male in no acute distress Head: Eyes PERRLA, No xanthomas. Normocephalic and atraumatic, oropharynx without edema or exudate.  Lungs: Resp regular and unlabored, CTA. TTP over upper chest.  Heart: RRR no s3, s4. Systolic murmur vs subclavian bruit.  Neck: No carotid bruits. No lymphadenopathy.  No JVD. Abdomen: Bowel sounds present, abdomen soft and non-tender without masses or hernias noted. Msk:  No spine or cva tenderness. No weakness, no joint deformities or effusions. Extremities: No clubbing, cyanosis or edema. DP/PT/Radials 2+ and equal bilaterally. Neuro: Alert and oriented X 3. No focal deficits noted. Psych:  Good affect, responds appropriately Skin: No rashes or lesions noted.  Labs:   Lab Results  Component Value Date   WBC 10.6 (H) 01/28/2016   HGB 14.1 01/28/2016   HCT 41.7 01/28/2016   MCV 87.2 01/28/2016   PLT 254 01/28/2016   No results for input(s):  INR in the last 72 hours.  Recent Labs Lab 01/28/16 1935 01/29/16 0546  NA 141  --   K 4.2  --   CL 106  --   CO2 27  --   BUN 23*  --   CREATININE 1.30*  --   CALCIUM 10.0  --   PROT  --  6.4*  BILITOT  --  0.5  ALKPHOS  --  47  ALT  --  17  AST  --  21  GLUCOSE 97  --   ALBUMIN  --  3.9   No results found for: MG  Recent Labs  01/28/16 1935 01/28/16 2345 01/29/16 0546  TROPONINI <0.03 <0.03 <0.03   No results for input(s): TROPIPOC in the   last 72 hours. No results found for: PROBNP No results found for: CHOL, HDL, LDLCALC, TRIG No results found for: DDIMER Lipase  Date/Time Value Ref Range Status  01/29/2016 05:46 AM 25 11 - 51 U/L Final   Cath 10/12/15 Conclusions Diagnostic Summary Severe stenosis of the 1st Diagonal Branch No restenosis of the Circumflex Otherwise non-obstructive coronary artery disease. Normal LV systolic function. Interventional Summary Successful PCI / Resolute Drug Eluting Stent of the proximal 1st Diagonal Coronary Artery. Interventional Recommendations Medical therapy for CAD Risk factor reduction Dual anti-platelet therapy with Prasugrel and Aspirin 81 mg for at least 6 months is recommended. I have reviewed the recent history and physical documentation. I personally spent 31 minutes continuously monitoring the patient during the administration of moderate sedation. Pre and post activities have been reviewed. I was present for the entire procedure.  Signatures  Electronically signed by Steven Rohrbeck, MD,  FACC(Interventional Physician) on 10/12/2015 11:25  Angiographic Findings  Cardiac Arteries and Lesion Findings LAD: The Proximal LAD is normal . Lesion on 1st Diag: 90% stenosis 12 mm length reduced to 0%. Pre procedure TIMI III flow was noted. Post Procedure TIMI III flow was present. Poor run off was present. The lesion was diagnosed as Low Risk (A). Lesion on Mid LAD: 50% stenosis 8 mm length . LMCA: Normal  appearance with 0% stenosis. LCx: Lesion on Mid CX: 10% stenosis 11 mm length . RCA: Lesion on Prox RCA: 20% stenosis 18 mm length . Lesion on Dist RCA: 20% stenosis 17 mm length . Procedure Data Procedure Date Date: 10/12/2015 Start: 10:43 Contrast Material - Omnipaque135 ml Fluoroscopy Time: Diagnostic: 10:03 minutes. PCI: 0:00 minutes. Total: 10:03 minutes. Admission Data Admission Date: 10/12/2015 Admission Status: Outpatient  Coronary Tree  Dominance: Right VA LV function assessed as:Normal. Ejection Fraction - Method: Estimated. EF%: 60.  LVA Segment Contractility  1 - Normal 3 - Mild5 - Severe7 - Dyskinesis  hypokinesis hypokinesis  2 - Hypokinesis4 - Moderate6 - Akinesis8 - Aneurysm  hypokinesis  Hemodynamics  Condition: Rest  O2 Consumption: Estimated: 234.52Heart Rate: 48 bpm Pressures +-----+-----------+ !Site !Pressure ! +-----+-----------+ !AO !131/74 (98)! +-----+-----------+ !LV !193/96 ,11 ! +-----+-----------+ Shunts Oxygen Values  O2 Consumption234.52  ECG:  Sinus rhythm at rate of 55 bpm  Radiology:  Dg Chest 2 View  Result Date: 01/28/2016 CLINICAL DATA:  64-year-old male with severe right chest pain. Initial encounter. Former smoker. EXAM: CHEST  2 VIEW COMPARISON:  10/08/2015 and earlier. FINDINGS: Stable tortuosity of the thoracic aorta. Other mediastinal contours are within normal limits. Visualized tracheal air column is within normal limits. No pneumothorax, pulmonary edema, pleural effusion or acute pulmonary opacity. Mild basilar predominant increased interstitial markings are stable and probably related to smoking. Chronic right lateral sixth rib fracture unchanged. Chronic L2 compression fracture is stable since 2015. Partially visible abdominal aortic endograft. IMPRESSION: No acute cardiopulmonary abnormality. Electronically Signed   By: H  Hall M.D.   On: 01/28/2016 20:21   Ct Angio  Chest/abd/pel For Dissection W And/or W/wo  Result Date: 01/28/2016 CLINICAL DATA:  64-year-old male right chest pain.  History of AAA. EXAM: CT ANGIOGRAPHY CHEST, ABDOMEN AND PELVIS TECHNIQUE: Multidetector CT imaging through the chest, abdomen and pelvis was performed using the standard protocol during bolus administration of intravenous contrast. Multiplanar reconstructed images and MIPs were obtained and reviewed to evaluate the vascular anatomy. CONTRAST:  100 cc Isovue 370 COMPARISON:  Chest radiograph dated 01/28/2016 and CT of the abdomen pelvis dated 06/22/2014 FINDINGS: CTA CHEST   FINDINGS There are minimal bibasilar dependent atelectatic changes of the lungs. There is no focal consolidation, pleural effusion, or pneumothorax. The central airways are patent. There is mild atherosclerotic calcification of the thoracic aorta. There is no dissection. The aortic isthmus measures approximately 3.4 cm in diameter. There is focal wall thickening of the proximal aspect of the left subclavian artery with high grade focal stenosis. The origins of the great vessels of the aortic arch are otherwise patent. There is no CT evidence of pulmonary embolism. There is no cardiomegaly or pericardial effusion. There is coronary vascular calcification. Calcified right hilar and mediastinal lymph nodes. There is no adenopathy. The esophagus is grossly unremarkable. No thyroid nodules identified. There is no axillary adenopathy. The chest wall soft tissues appear unremarkable. Multiple right posterior rib old healed rib fractures. There are multilevel degenerative changes of the spine. No acute fracture. Review of the MIP images confirms the above findings. CTA ABDOMEN AND PELVIS FINDINGS No intra-abdominal free air or free fluid. There is mild irregularity of the hepatic contour may represent mild underlying cirrhosis. Clinical correlation is recommended. Small gallstones may be present. There is fluid there is fatty  infiltration of the uncinate process and proximal pancreas. The spleen, adrenal glands, appear unremarkable. There is no hydronephrosis on either side. Stable appearing bilateral renal cysts measuring up to 3.6 cm on the right. The visualized ureters and urinary bladder appear unremarkable. The prostate and seminal vesicles are grossly unremarkable. There is sigmoid diverticulosis without active inflammatory changes. There is moderate stool throughout the colon. No evidence of bowel obstruction or active inflammation. Normal appendix. There is stable appearing noncalcified plaque versus scarring along the posterior wall of the abdominal aorta superior to the level of the renal arteries. An aorto bi-iliac endovascular stent graft repair is noted and appears patent. There is no extravasation of contrast outside of the confines of the stent graft to suggest a leak. The origins of the celiac axis, SMA, IMA as well as the origins of the renal arteries appear patent. There is a classic celiac axis branching anatomy. No portal venous gas identified. There is no adenopathy. The abdominal wall soft tissues appear unremarkable. There is osteopenia with degenerative changes of the spine. There is a stable appearing L2 compression deformity. No acute fracture. Review of the MIP images confirms the above findings. IMPRESSION: No CT evidence of aortic dissection or pulmonary embolism. Mildly dilated aortic isthmus measuring 3.4 cm in diameter. Focal high-grade narrowing of the first proximal left subclavian artery. This has progressed compared to the CT dated 08/16/2013. Infrarenal aortobifemoral stent graft repair appears patent with no evidence of endoleak. Electronically Signed   By: Arash  Radparvar M.D.   On: 01/28/2016 21:33    ASSESSMENT AND PLAN:     1. Chest pain  - No to prior MI and AAA. EKG without acute ischemic changes. Troponin x 3 negative. Patient continues to have ongoing chest pain which is reproducible  with palpation. Will review with M.D. for further evaluation.  2.Abnormal CT angio of chest/abdomen - As described above. M.D. to decide further evaluation.  3. HTN - Minimally elevated. Consider increasing lisinopril. Creatinine of 1.3 yesterday. Repeat first.   4.  CAD - S/P DES to cir to OM and diagonal branch.   5. H/O endovascular stent graft for abdominal aortic aneurysm      Signed: Bhagat,Bhavinkumar, PA 01/29/2016, 8:29 AM Pager [phone]  Co-Sign MD  Attending Note:   The patient was seen and examined.  Agree   with assessment and plan as noted above.  Changes made to the above note as needed.  Patient seen and independently examined with Vin bhagat, PA .   We discussed all aspects of the encounter. I agree with the assessment and plan as stated above.  Pt presents with CP - very similar to his previous CP prior to his MI. Also has some sharp right sided CP similar to his AAA pain  CT angio has ruled out AAA issues  ECG is unremarkable Troponins are negative.  Will refer for cath - his ongoing CP is concerning  He had a stent placed 3-4 months ago   2. L subclavian stenosis :   + bruit.   Follow up with his vascular surgeon in [place] .    I have spent a total of 40 minutes with patient reviewing hospital  notes , telemetry, EKGs, labs and examining patient as well as establishing an assessment and plan that was discussed with the patient. > 50% of time was spent in direct patient care.    Shalonda Sachse J. Quitman Norberto, Jr., MD, FACC 01/29/2016, 8:59 AM [address],  [address] Office - [phone] Pager [phone]    

## 2016-01-29 NOTE — Interval H&P Note (Signed)
History and Physical Interval Note:  01/29/2016 2:48 PM  Ronald Mullins  has presented today for cardiac cath, with the diagnosis of cp  The various methods of treatment have been discussed with the patient and family. After consideration of risks, benefits and other options for treatment, the patient has consented to  Procedure(s): Left Heart Cath and Coronary Angiography (N/A) as a surgical intervention .  The patient's history has been reviewed, patient examined, no change in status, stable for surgery.  I have reviewed the patient's chart and labs.  Questions were answered to the patient's satisfaction.    Cath Lab Visit (complete for each Cath Lab visit)  Clinical Evaluation Leading to the Procedure:   ACS: Yes.    Non-ACS:    Anginal Classification: CCS IV  Anti-ischemic medical therapy: Minimal Therapy (1 class of medications)  Non-Invasive Test Results: No non-invasive testing performed  Prior CABG: No previous CABG    Lance Muss

## 2016-01-29 NOTE — H&P (View-Only) (Signed)
CARDIOLOGY CONSULT NOTE   Patient ID: Ronald Mullins MRN: [id number] DOB/AGE: [birth year] 65 y.o.  Admit date: 01/28/2016  Primary Physician   Everrett Coombe, DO Primary Cardiologist   Leeann Must Dwyane Luo, MD  [address]  [place], [place]  Vascular surgery: Motew, Jaquelyn Bitter, MD  Reason for Consultation   Chest pain  Requesting Physician  Dr. Isidoro Donning  HPI: Ronald Mullins is a 65 y.o. male with a history of AAA s/p endovascular stent graft, CAD, MI, HLD, HTN who presented with CP.   Seen by Dr. Dallie Dad 10/19/15 for AAA follow up. Per note his most current CT scan shows significant shrinkage of his sac measuring approximately 3.2 cm. The rest of his graft and examination is normal.  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 He received drug-eluting stents to the circumflex and obtuse marginal branch and at that time an echo revealed an LVEF of 45-50% with inferior lateral hypokinesis and no significant valvular heart disease. He electively came back in April 2017 to have a drug-eluting stent placed to a diagonal branch. He does have a residual 50% LAD disease but only mild disease elsewhere in his stents were patent. According to the cardiac catheterization report, his LV function was normal in April.  He was doing well on cardiac stand point when seen last by Dr. Leeann Must 12/09/15.   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 This pain is similar to prior MI and last for about 30 mins, did not resolved then he had right sided sharp chest pain that felt like AAA rapture. Not relieved with rest. Associated with SOB. The pain persisted and brought to ER for further evaluation. No improvement on nitro. Eased with morphine. EKG showed NSR. No acute changes. Troponin x 3 negative. Scr of 1.3. CXR clear.  CT angio of chest/abdomen/pelvis showed:  IMPRESSION: No CT  evidence of aortic dissection or pulmonary embolism. Mildly dilated aortic isthmus measuring 3.4 cm in diameter.  Focal high-grade narrowing of the first proximal left subclavian artery. This has progressed compared to the CT dated 08/16/2013.  Infrarenal aortobifemoral stent graft repair appears patent with no evidence of endoleak.   Currently having chest discomfort.    Past Medical History:  Diagnosis Date  . AAA (abdominal aortic aneurysm) without rupture (HCC)   . CAD (coronary artery disease), native coronary artery   . High cholesterol   . Hypertension   . Myocardial infarct [facility])      Past Surgical History:  Procedure Laterality Date  . ABDOMINAL AORTIC ANEURYSM REPAIR    . ABDOMINAL AORTIC ENDOVASCULAR STENT GRAFT    . CORONARY STENT PLACEMENT      No Known Allergies  I have reviewed the patient's current medications . aspirin EC  81 mg Oral Daily  . atorvastatin  80 mg Oral q1800  . carvedilol  3.125 mg Oral BID WC  . enoxaparin (LOVENOX) injection  40 mg Subcutaneous Q24H  . gabapentin  300 mg Oral BID  . lisinopril  5 mg Oral Daily  . prasugrel  10 mg Oral Daily     acetaminophen, gi cocktail, morphine injection, nitroGLYCERIN, ondansetron (ZOFRAN) IV  Prior to Admission medications   Medication Sig Start Date End Date Taking? Authorizing Provider  aspirin 81 MG tablet Take 81 mg by mouth daily.   Yes Historical Provider, MD  atorvastatin (LIPITOR) 80 MG  tablet Take 80 mg by mouth daily at 6 PM.   Yes Historical Provider, MD  carvedilol (COREG) 3.125 MG tablet Take 3.125 mg by mouth 2 (two) times daily with a meal.   Yes Historical Provider, MD  gabapentin (NEURONTIN) 300 MG capsule Take 300 mg by mouth 2 (two) times daily.   Yes Historical Provider, MD  lisinopril (PRINIVIL,ZESTRIL) 5 MG tablet Take 5 mg by mouth daily.   Yes Historical Provider, MD  Prasugrel HCl (EFFIENT PO) Take 10 mg by mouth daily.    Yes Historical Provider, MD     Social  History   Social History  . Marital status: Divorced    Spouse name: N/A  . Number of children: N/A  . Years of education: N/A   Occupational History  . Not on file.   Social History Main Topics  . Smoking status: Former Games developer  . Smokeless tobacco: Never Used  . Alcohol use No  . Drug use: No  . Sexual activity: Not on file   Other Topics Concern  . Not on file   Social History Narrative  . No narrative on file    Family Status  Relation Status  . Mother   . Father Deceased  . Sister    Family History  Problem Relation Age of Onset  . Hypertension Mother   . Diabetes Mother   . Hypertension Father     Deceased after valve surgery  . Celiac disease Sister       ROS:  Full 14 point review of systems complete and found to be negative unless listed above.  Physical Exam: Blood pressure (!) 157/87, pulse (!) 53, temperature 98.2 F (36.8 C), temperature source Oral, resp. rate 17, height 5\' 11"  (1.803 m), weight 210 lb 4.8 oz (95.4 kg), SpO2 97 %.  General: Well developed, well nourished, male in no acute distress Head: Eyes PERRLA, No xanthomas. Normocephalic and atraumatic, oropharynx without edema or exudate.  Lungs: Resp regular and unlabored, CTA. TTP over upper chest.  Heart: RRR no s3, s4. Systolic murmur vs subclavian bruit.  Neck: No carotid bruits. No lymphadenopathy.  No JVD. Abdomen: Bowel sounds present, abdomen soft and non-tender without masses or hernias noted. Msk:  No spine or cva tenderness. No weakness, no joint deformities or effusions. Extremities: No clubbing, cyanosis or edema. DP/PT/Radials 2+ and equal bilaterally. Neuro: Alert and oriented X 3. No focal deficits noted. Psych:  Good affect, responds appropriately Skin: No rashes or lesions noted.  Labs:   Lab Results  Component Value Date   WBC 10.6 (H) 01/28/2016   HGB 14.1 01/28/2016   HCT 41.7 01/28/2016   MCV 87.2 01/28/2016   PLT 254 01/28/2016   No results for input(s):  INR in the last 72 hours.  Recent Labs Lab 01/28/16 1935 01/29/16 0546  NA 141  --   K 4.2  --   CL 106  --   CO2 27  --   BUN 23*  --   CREATININE 1.30*  --   CALCIUM 10.0  --   PROT  --  6.4*  BILITOT  --  0.5  ALKPHOS  --  47  ALT  --  17  AST  --  21  GLUCOSE 97  --   ALBUMIN  --  3.9   No results found for: MG  Recent Labs  01/28/16 1935 01/28/16 2345 01/29/16 0546  TROPONINI <0.03 <0.03 <0.03   No results for input(s): TROPIPOC in the  last 72 hours. No results found for: PROBNP No results found for: CHOL, HDL, LDLCALC, TRIG No results found for: DDIMER Lipase  Date/Time Value Ref Range Status  01/29/2016 05:46 AM 25 11 - 51 U/L Final   Cath 10/12/15 Conclusions Diagnostic Summary Severe stenosis of the 1st Diagonal Branch No restenosis of the Circumflex Otherwise non-obstructive coronary artery disease. Normal LV systolic function. Interventional Summary Successful PCI / Resolute Drug Eluting Stent of the proximal 1st Diagonal Coronary Artery. Interventional Recommendations Medical therapy for CAD Risk factor reduction Dual anti-platelet therapy with Prasugrel and Aspirin 81 mg for at least 6 months is recommended. I have reviewed the recent history and physical documentation. I personally spent 31 minutes continuously monitoring the patient during the administration of moderate sedation. Pre and post activities have been reviewed. I was present for the entire procedure.  Signatures  Electronically signed by Cathlean Cower, MD,  FACC(Interventional Physician) on 10/12/2015 11:25  Angiographic Findings  Cardiac Arteries and Lesion Findings LAD: The Proximal LAD is normal . Lesion on 1st Diag: 90% stenosis 12 mm length reduced to 0%. Pre procedure TIMI III flow was noted. Post Procedure TIMI III flow was present. Poor run off was present. The lesion was diagnosed as Low Risk (A). Lesion on Mid LAD: 50% stenosis 8 mm length . LMCA: Normal  appearance with 0% stenosis. LCx: Lesion on Mid CX: 10% stenosis 11 mm length . RCA: Lesion on Prox RCA: 20% stenosis 18 mm length . Lesion on Dist RCA: 20% stenosis 17 mm length . Procedure Data Procedure Date Date: 10/12/2015 Start: 10:43 Contrast Material - Omnipaque135 ml Fluoroscopy Time: Diagnostic: 10:03 minutes. PCI: 0:00 minutes. Total: 10:03 minutes. Admission Data Admission Date: 10/12/2015 Admission Status: Outpatient  Coronary Tree  Dominance: Right VA LV function assessed MB:TDHRCB. Ejection Fraction - Method: Estimated. EF%: 60.  LVA Segment Contractility  1 - Normal 3 - Mild5 - Severe7 - Dyskinesis  hypokinesis hypokinesis  2 - Hypokinesis4 - Moderate6 - Akinesis8 - Aneurysm  hypokinesis  Hemodynamics  Condition: Rest  O2 Consumption: Estimated: 234.52Heart Rate: 48 bpm Pressures +-----+-----------+ !Site !Pressure ! +-----+-----------+ !AO !131/74 (98)! +-----+-----------+ !LV !193/96 ,11 ! +-----+-----------+ Shunts Oxygen Values  O2 Consumption234.52  ECG:  Sinus rhythm at rate of 55 bpm  Radiology:  Dg Chest 2 View  Result Date: 01/28/2016 CLINICAL DATA:  65 year old male with severe right chest pain. Initial encounter. Former smoker. EXAM: CHEST  2 VIEW COMPARISON:  10/08/2015 and earlier. FINDINGS: Stable tortuosity of the thoracic aorta. Other mediastinal contours are within normal limits. Visualized tracheal air column is within normal limits. No pneumothorax, pulmonary edema, pleural effusion or acute pulmonary opacity. Mild basilar predominant increased interstitial markings are stable and probably related to smoking. Chronic right lateral sixth rib fracture unchanged. Chronic L2 compression fracture is stable since 2015. Partially visible abdominal aortic endograft. IMPRESSION: No acute cardiopulmonary abnormality. Electronically Signed   By: Odessa Fleming M.D.   On: 01/28/2016 20:21   Ct Angio  Chest/abd/pel For Dissection W And/or W/wo  Result Date: 01/28/2016 CLINICAL DATA:  65 year old male right chest pain.  History of AAA. EXAM: CT ANGIOGRAPHY CHEST, ABDOMEN AND PELVIS TECHNIQUE: Multidetector CT imaging through the chest, abdomen and pelvis was performed using the standard protocol during bolus administration of intravenous contrast. Multiplanar reconstructed images and MIPs were obtained and reviewed to evaluate the vascular anatomy. CONTRAST:  100 cc Isovue 370 COMPARISON:  Chest radiograph dated 01/28/2016 and CT of the abdomen pelvis dated 06/22/2014 FINDINGS: CTA CHEST  FINDINGS There are minimal bibasilar dependent atelectatic changes of the lungs. There is no focal consolidation, pleural effusion, or pneumothorax. The central airways are patent. There is mild atherosclerotic calcification of the thoracic aorta. There is no dissection. The aortic isthmus measures approximately 3.4 cm in diameter. There is focal wall thickening of the proximal aspect of the left subclavian artery with high grade focal stenosis. The origins of the great vessels of the aortic arch are otherwise patent. There is no CT evidence of pulmonary embolism. There is no cardiomegaly or pericardial effusion. There is coronary vascular calcification. Calcified right hilar and mediastinal lymph nodes. There is no adenopathy. The esophagus is grossly unremarkable. No thyroid nodules identified. There is no axillary adenopathy. The chest wall soft tissues appear unremarkable. Multiple right posterior rib old healed rib fractures. There are multilevel degenerative changes of the spine. No acute fracture. Review of the MIP images confirms the above findings. CTA ABDOMEN AND PELVIS FINDINGS No intra-abdominal free air or free fluid. There is mild irregularity of the hepatic contour may represent mild underlying cirrhosis. Clinical correlation is recommended. Small gallstones may be present. There is fluid there is fatty  infiltration of the uncinate process and proximal pancreas. The spleen, adrenal glands, appear unremarkable. There is no hydronephrosis on either side. Stable appearing bilateral renal cysts measuring up to 3.6 cm on the right. The visualized ureters and urinary bladder appear unremarkable. The prostate and seminal vesicles are grossly unremarkable. There is sigmoid diverticulosis without active inflammatory changes. There is moderate stool throughout the colon. No evidence of bowel obstruction or active inflammation. Normal appendix. There is stable appearing noncalcified plaque versus scarring along the posterior wall of the abdominal aorta superior to the level of the renal arteries. An aorto bi-iliac endovascular stent graft repair is noted and appears patent. There is no extravasation of contrast outside of the confines of the stent graft to suggest a leak. The origins of the celiac axis, SMA, IMA as well as the origins of the renal arteries appear patent. There is a classic celiac axis branching anatomy. No portal venous gas identified. There is no adenopathy. The abdominal wall soft tissues appear unremarkable. There is osteopenia with degenerative changes of the spine. There is a stable appearing L2 compression deformity. No acute fracture. Review of the MIP images confirms the above findings. IMPRESSION: No CT evidence of aortic dissection or pulmonary embolism. Mildly dilated aortic isthmus measuring 3.4 cm in diameter. Focal high-grade narrowing of the first proximal left subclavian artery. This has progressed compared to the CT dated 08/16/2013. Infrarenal aortobifemoral stent graft repair appears patent with no evidence of endoleak. Electronically Signed   By: Elgie Collard M.D.   On: 01/28/2016 21:33    ASSESSMENT AND PLAN:     1. Chest pain  - No to prior MI and AAA. EKG without acute ischemic changes. Troponin x 3 negative. Patient continues to have ongoing chest pain which is reproducible  with palpation. Will review with M.D. for further evaluation.  2.Abnormal CT angio of chest/abdomen - As described above. M.D. to decide further evaluation.  3. HTN - Minimally elevated. Consider increasing lisinopril. Creatinine of 1.3 yesterday. Repeat first.   4.  CAD - S/P DES to cir to OM and diagonal branch.   5. H/O endovascular stent graft for abdominal aortic aneurysm      Signed: Bhagat,Bhavinkumar, PA 01/29/2016, 8:29 AM Pager [phone]  Co-Sign MD  Attending Note:   The patient was seen and examined.  Agree  with assessment and plan as noted above.  Changes made to the above note as needed.  Patient seen and independently examined with Vin bhagat, PA .   We discussed all aspects of the encounter. I agree with the assessment and plan as stated above.  Pt presents with CP - very similar to his previous CP prior to his MI. Also has some sharp right sided CP similar to his AAA pain  CT angio has ruled out AAA issues  ECG is unremarkable Troponins are negative.  Will refer for cath - his ongoing CP is concerning  He had a stent placed 3-4 months ago   2. L subclavian stenosis :   + bruit.   Follow up with his vascular surgeon in [facility] .    I have spent a total of 40 minutes with patient reviewing hospital  notes , telemetry, EKGs, labs and examining patient as well as establishing an assessment and plan that was discussed with the patient. > 50% of time was spent in direct patient care.    Vesta Mixer, Montez Hageman., MD, [facility] 01/29/2016, 8:59 AM [address],  [address] Office [phone] Pager [phone]

## 2016-01-30 DIAGNOSIS — R079 Chest pain, unspecified: Secondary | ICD-10-CM

## 2016-01-30 DIAGNOSIS — I200 Unstable angina: Secondary | ICD-10-CM

## 2016-01-30 DIAGNOSIS — I209 Angina pectoris, unspecified: Secondary | ICD-10-CM | POA: Diagnosis not present

## 2016-01-30 DIAGNOSIS — I7102 Dissection of abdominal aorta: Secondary | ICD-10-CM | POA: Diagnosis not present

## 2016-01-30 DIAGNOSIS — I2583 Coronary atherosclerosis due to lipid rich plaque: Secondary | ICD-10-CM | POA: Diagnosis not present

## 2016-01-30 LAB — GLUCOSE, CAPILLARY: Glucose-Capillary: 149 mg/dL — ABNORMAL HIGH (ref 65–99)

## 2016-01-30 MED ORDER — NITROGLYCERIN 0.4 MG SL SUBL: 0.4000 mg | SUBLINGUAL_TABLET | SUBLINGUAL | 12 refills | Status: AC | PRN

## 2016-01-30 MED ORDER — PANTOPRAZOLE SODIUM 40 MG PO TBEC: 40.0000 mg | DELAYED_RELEASE_TABLET | Freq: Every day | ORAL | 0 refills | Status: AC

## 2016-01-30 NOTE — Discharge Summary (Signed)
Physician Discharge Summary   Patient ID: Ronald Mullins MRN: [id number] DOB/AGE: 11-16-1950 65 y.o.  Admit date: 01/28/2016 Discharge date: 01/30/2016  Primary Care Physician:  Everrett Coombe, DO  Discharge Diagnoses:        Chest pain . GERD . Essential hypertension . CAD/Coronary arteriosclerosis due to lipid rich plaque   Consults:  Cardiology  Recommendations for Outpatient Follow-up:  1. Patient placed on PPI daily for 4 weeks 2. Please repeat CBC/BMET at next visit   DIET: Heart healthy diet    Allergies:  No Known Allergies   DISCHARGE MEDICATIONS: Current Discharge Medication List    START taking these medications   Details  nitroGLYCERIN (NITROSTAT) 0.4 MG SL tablet Place 1 tablet (0.4 mg total) under the tongue every 5 (five) minutes as needed for chest pain. Qty: 30 tablet, Refills: 12    pantoprazole (PROTONIX) 40 MG tablet Take 1 tablet (40 mg total) by mouth daily. Qty: 30 tablet, Refills: 0      CONTINUE these medications which have NOT CHANGED   Details  aspirin 81 MG tablet Take 81 mg by mouth daily.    atorvastatin (LIPITOR) 80 MG tablet Take 80 mg by mouth daily at 6 PM.    carvedilol (COREG) 3.125 MG tablet Take 3.125 mg by mouth 2 (two) times daily with a meal.    gabapentin (NEURONTIN) 300 MG capsule Take 300 mg by mouth 2 (two) times daily.    lisinopril (PRINIVIL,ZESTRIL) 5 MG tablet Take 5 mg by mouth daily.    prasugrel (EFFIENT) 10 MG TABS tablet Take 10 mg by mouth daily.         Brief H and P: For complete details please refer to admission H and P, but in brief Ronald Mullins a 65 y.o.malewith a past medical history significant for AAA s/p rupture in 2015 and endo-repair, STEMI in Oct 2016 s/p DES (to LCx-OM1 bifurcation)and then stagedDES to residual D1 in 4/2017who presents with chest pain. The patient has been pain-free since his STEMI last October until tonight he was on the baseball field (he is president of a  baseball league in [facility]) and had sudden onset of substernal chest discomfort while just standing there. This was vague and pressure-like and similar to his previous angina, and associated with some panicking, sweating. He sat down and it didn't go away, and then he had onset of sharp, severe, brief shooting/stabbing pains in his right chest like his aneurysm rupture and so he went to the ER. ED course: -Afebrile, hemodynamically stable, hypertensive -Initial ECG showed normal sinus rhythm without ST changesand troponin was negative. -Na 141, K 4.2, Cr 1.3(baseline 1.2), WBC 10.6, Hgb 14.1 -A CTA chest abdomen and pelvis dissection study was done that showed no change in his chronic endovascularly repaired aneurysm, no gallbladder disease, no pancreatic disease, no PE, pneumothorax or pneumonia -He was given morphine and nitro and his pain eased slightly and [facility] were asked to evaluate for admission because [facility] where his primary cardiologist is was full -TRH was asked to admit for observation, serial troponins and risk stratification. The pain started abruptly, was not food related or related to movement. No recent heavy lifting or arm movement.   Hospital Course:  Chest pain: with strong history of coronary artery disease, per patient PCI in October 2016 and in April 2017 - Cardiology consulted, recommended cardiac catheterization , nonobstructive CAD at this time, recommended medical therapy for atypical chest pain - Serial cardiac enzymes negative so far  -  Continue aspirin, Effient, statin, beta blocker, lisinopril - Cardiac enzymes negative so far, EKG with no acute ST-T wave changes suggestive of ischemia - CT angiogram of the chest/abdomen showed no aortic dissection or PE  CAD: -Continue aspirin, Effient -Continue statin -Continue BB and ACEi  Neuropathy: -Continue gabapentin  GERD Patient did complain of a burping and belching hence placed on PPI.  Recommended to see GI if no  improvement in his symptoms  Day of Discharge BP (!) 82/49 (BP Location: Left Arm)   Pulse 61   Temp 98.9 F (37.2 C) (Oral)   Resp (!) 57   Ht 5\' 11"  (1.803 m)   Wt 97.8 kg (215 lb 9.8 oz)   SpO2 98%   BMI 30.07 kg/m   Physical Exam: General: Alert and awake oriented x3 not in any acute distress. HEENT: anicteric sclera, pupils reactive to light and accommodation CVS: S1-S2 clear no murmur rubs or gallops Chest: clear to auscultation bilaterally, no wheezing rales or rhonchi Abdomen: soft nontender, nondistended, normal bowel sounds Extremities: no cyanosis, clubbing or edema noted bilaterally Neuro: Cranial nerves II-XII intact, no focal neurological deficits   The results of significant diagnostics from this hospitalization (including imaging, microbiology, ancillary and laboratory) are listed below for reference.    LAB RESULTS: Basic Metabolic Panel:  Recent Labs Lab 01/28/16 1935 01/29/16 1112  NA 141 137  K 4.2 4.0  CL 106 102  CO2 27 27  GLUCOSE 97 92  BUN 23* 16  CREATININE 1.30* 1.18  CALCIUM 10.0 9.6   Liver Function Tests:  Recent Labs Lab 01/29/16 0546  AST 21  ALT 17  ALKPHOS 47  BILITOT 0.5  PROT 6.4*  ALBUMIN 3.9    Recent Labs Lab 01/29/16 0546  LIPASE 25   No results for input(s): AMMONIA in the last 168 hours. CBC:  Recent Labs Lab 01/28/16 1935  WBC 10.6*  HGB 14.1  HCT 41.7  MCV 87.2  PLT 254   Cardiac Enzymes:  Recent Labs Lab 01/28/16 2345 01/29/16 0546  TROPONINI <0.03 <0.03   BNP: Invalid input(s): POCBNP CBG:  Recent Labs Lab 01/30/16 0755  GLUCAP 149*    Significant Diagnostic Studies:  Dg Chest 2 View  Result Date: 01/28/2016 CLINICAL DATA:  65 year old male with severe right chest pain. Initial encounter. Former smoker. EXAM: CHEST  2 VIEW COMPARISON:  10/08/2015 and earlier. FINDINGS: Stable tortuosity of the thoracic aorta. Other mediastinal contours are within  normal limits. Visualized tracheal air column is within normal limits. No pneumothorax, pulmonary edema, pleural effusion or acute pulmonary opacity. Mild basilar predominant increased interstitial markings are stable and probably related to smoking. Chronic right lateral sixth rib fracture unchanged. Chronic L2 compression fracture is stable since 2015. Partially visible abdominal aortic endograft. IMPRESSION: No acute cardiopulmonary abnormality. Electronically Signed   By: Odessa Fleming M.D.   On: 01/28/2016 20:21   Dg Ribs Bilateral W/chest  Result Date: 01/29/2016 CLINICAL DATA:  Anterior chest pressure across sternum, sudden onset. Recent MI. EXAM: BILATERAL RIBS AND CHEST - 4+ VIEW COMPARISON:  Chest x-rays dated 01/28/2016 and 10/08/2015. FINDINGS: Heart size is normal. Overall cardiomediastinal silhouette remains normal in size and configuration. Mild prominence of the interstitial markings again noted at each lung base, stable. No new lung findings. No evidence of pneumonia. No pleural effusion or pneumothorax seen. Old healed rib fractures on the right. Osseous structures about the chest are otherwise unremarkable. No acute appearing rib fracture or displacement. IMPRESSION: 1. No acute findings.  No evidence of pneumonia. No pleural effusion or pneumothorax seen. 2. Old healed rib fractures on the right. No acute-appearing osseous abnormality. Electronically Signed   By: Bary Richard M.D.   On: 01/29/2016 17:48   Ct Angio Chest/abd/pel For Dissection W And/or W/wo  Result Date: 01/28/2016 CLINICAL DATA:  65 year old male right chest pain.  History of AAA. EXAM: CT ANGIOGRAPHY CHEST, ABDOMEN AND PELVIS TECHNIQUE: Multidetector CT imaging through the chest, abdomen and pelvis was performed using the standard protocol during bolus administration of intravenous contrast. Multiplanar reconstructed images and MIPs were obtained and reviewed to evaluate the vascular anatomy. CONTRAST:  100 cc Isovue 370  COMPARISON:  Chest radiograph dated 01/28/2016 and CT of the abdomen pelvis dated 06/22/2014 FINDINGS: CTA CHEST FINDINGS There are minimal bibasilar dependent atelectatic changes of the lungs. There is no focal consolidation, pleural effusion, or pneumothorax. The central airways are patent. There is mild atherosclerotic calcification of the thoracic aorta. There is no dissection. The aortic isthmus measures approximately 3.4 cm in diameter. There is focal wall thickening of the proximal aspect of the left subclavian artery with high grade focal stenosis. The origins of the great vessels of the aortic arch are otherwise patent. There is no CT evidence of pulmonary embolism. There is no cardiomegaly or pericardial effusion. There is coronary vascular calcification. Calcified right hilar and mediastinal lymph nodes. There is no adenopathy. The esophagus is grossly unremarkable. No thyroid nodules identified. There is no axillary adenopathy. The chest wall soft tissues appear unremarkable. Multiple right posterior rib old healed rib fractures. There are multilevel degenerative changes of the spine. No acute fracture. Review of the MIP images confirms the above findings. CTA ABDOMEN AND PELVIS FINDINGS No intra-abdominal free air or free fluid. There is mild irregularity of the hepatic contour may represent mild underlying cirrhosis. Clinical correlation is recommended. Small gallstones may be present. There is fluid there is fatty infiltration of the uncinate process and proximal pancreas. The spleen, adrenal glands, appear unremarkable. There is no hydronephrosis on either side. Stable appearing bilateral renal cysts measuring up to 3.6 cm on the right. The visualized ureters and urinary bladder appear unremarkable. The prostate and seminal vesicles are grossly unremarkable. There is sigmoid diverticulosis without active inflammatory changes. There is moderate stool throughout the colon. No evidence of bowel  obstruction or active inflammation. Normal appendix. There is stable appearing noncalcified plaque versus scarring along the posterior wall of the abdominal aorta superior to the level of the renal arteries. An aorto bi-iliac endovascular stent graft repair is noted and appears patent. There is no extravasation of contrast outside of the confines of the stent graft to suggest a leak. The origins of the celiac axis, SMA, IMA as well as the origins of the renal arteries appear patent. There is a classic celiac axis branching anatomy. No portal venous gas identified. There is no adenopathy. The abdominal wall soft tissues appear unremarkable. There is osteopenia with degenerative changes of the spine. There is a stable appearing L2 compression deformity. No acute fracture. Review of the MIP images confirms the above findings. IMPRESSION: No CT evidence of aortic dissection or pulmonary embolism. Mildly dilated aortic isthmus measuring 3.4 cm in diameter. Focal high-grade narrowing of the first proximal left subclavian artery. This has progressed compared to the CT dated 08/16/2013. Infrarenal aortobifemoral stent graft repair appears patent with no evidence of endoleak. Electronically Signed   By: Elgie Collard M.D.   On: 01/28/2016 21:33    Procedures  Left Heart Cath and Coronary Angiography  Conclusion     Prior second diagonal stent is widely patent.  Mid LAD lesion, 50 %stenosed.  Dist Cx-1 lesion, 20 %stenosed.  Distal Cx stent is widely patent.  2nd Mrg-1 lesion, 20 %stenosed.  OM2 stent is widely patent.  The left ventricular systolic function is normal.  LV end diastolic pressure is normal.  The left ventricular ejection fraction is 55-65% by visual estimate.   Plan: 1.  Continue dual antiplatelet therapy and aggressive secondary prevention. 2.  Medical therapy for atypical chest pain with non-obstructive coronary artery disease.      Disposition and  Follow-up: Discharge Instructions    Diet - low sodium heart healthy    Complete by:  As directed   Increase activity slowly    Complete by:  As directed       DISPOSITION: home    DISCHARGE FOLLOW-UP Follow-up Information    Everrett Coombe, DO. Schedule an appointment as soon as possible for a visit in 2 week(s).   Specialty:  Family Medicine Contact information: [address] [place] [address] [phone]            Time spent on Discharge:   Signed:   Bralin Garry M.D. [facility] 01/30/2016, 11:14 AM Pager: [phone]

## 2016-01-30 NOTE — Discharge Instructions (Signed)

## 2019-07-20 ENCOUNTER — Ambulatory Visit: Payer: Medicare HMO | Attending: Internal Medicine

## 2019-07-20 DIAGNOSIS — Z23 Encounter for immunization: Secondary | ICD-10-CM | POA: Insufficient documentation

## 2019-07-20 NOTE — Progress Notes (Signed)
   Covid-19 Vaccination Clinic  Name:  Ronald Mullins    MRN: [id number] DOB: Mar 04, 1951  07/20/2019  [name] was observed post Covid-19 immunization for 15 minutes without incidence. He was provided with Vaccine Information Sheet and instruction to access the V-Safe system.   [name] was instructed to call 911 with any severe reactions post vaccine: Marland Kitchen Difficulty breathing  . Swelling of your face and throat  . A fast heartbeat  . A bad rash all over your body  . Dizziness and weakness    Immunizations Administered    Name Date Dose VIS Date Route   Pfizer COVID-19 Vaccine 07/20/2019 11:06 AM 0.3 mL 06/07/2019 Intramuscular   Manufacturer: [employer], [employer]   Lot: [id number]   NDC: [address]-6

## 2019-07-28 ENCOUNTER — Ambulatory Visit: Payer: 59

## 2019-08-02 ENCOUNTER — Ambulatory Visit: Payer: 59

## 2019-08-10 ENCOUNTER — Ambulatory Visit: Payer: Medicare HMO | Attending: Internal Medicine

## 2019-08-10 DIAGNOSIS — Z23 Encounter for immunization: Secondary | ICD-10-CM | POA: Insufficient documentation

## 2019-08-10 NOTE — Progress Notes (Signed)
   Covid-19 Vaccination Clinic  Name:  Blade Scheff    MRN: [id number] DOB: 10/04/1950  08/10/2019  Mr. Echavarria was observed post Covid-19 immunization for 15 minutes without incidence. He was provided with Vaccine Information Sheet and instruction to access the V-Safe system.   Mr. Benavides was instructed to call 911 with any severe reactions post vaccine: Marland Kitchen Difficulty breathing  . Swelling of your face and throat  . A fast heartbeat  . A bad rash all over your body  . Dizziness and weakness    Immunizations Administered    Name Date Dose VIS Date Route   Pfizer COVID-19 Vaccine 08/10/2019  1:16 PM 0.3 mL 06/07/2019 Intramuscular   Manufacturer: [employer], [employer]   Lot: [id number]   NDC: [address]-5

## 2019-10-11 ENCOUNTER — Encounter (HOSPITAL_COMMUNITY): Payer: Self-pay | Admitting: Emergency Medicine

## 2019-10-11 ENCOUNTER — Inpatient Hospital Stay (HOSPITAL_COMMUNITY)
Admission: EM | Admit: 2019-10-11 | Discharge: 2019-10-13 | DRG: 313 | Disposition: A | Discharge status: Home / Self Care | Payer: Medicare HMO | Attending: Student in an Organized Health Care Education/Training Program | Admitting: Student in an Organized Health Care Education/Training Program

## 2019-10-11 ENCOUNTER — Other Ambulatory Visit: Payer: Self-pay

## 2019-10-11 ENCOUNTER — Emergency Department (HOSPITAL_COMMUNITY): Payer: Medicare HMO

## 2019-10-11 DIAGNOSIS — Z955 Presence of coronary angioplasty implant and graft: Secondary | ICD-10-CM

## 2019-10-11 DIAGNOSIS — Z8679 Personal history of other diseases of the circulatory system: Secondary | ICD-10-CM

## 2019-10-11 DIAGNOSIS — I252 Old myocardial infarction: Secondary | ICD-10-CM

## 2019-10-11 DIAGNOSIS — Z7982 Long term (current) use of aspirin: Secondary | ICD-10-CM

## 2019-10-11 DIAGNOSIS — I25110 Atherosclerotic heart disease of native coronary artery with unstable angina pectoris: Secondary | ICD-10-CM | POA: Diagnosis present

## 2019-10-11 DIAGNOSIS — I708 Atherosclerosis of other arteries: Secondary | ICD-10-CM | POA: Diagnosis present

## 2019-10-11 DIAGNOSIS — R079 Chest pain, unspecified: Secondary | ICD-10-CM

## 2019-10-11 DIAGNOSIS — I200 Unstable angina: Secondary | ICD-10-CM | POA: Diagnosis present

## 2019-10-11 DIAGNOSIS — R001 Bradycardia, unspecified: Secondary | ICD-10-CM | POA: Diagnosis present

## 2019-10-11 DIAGNOSIS — Z20822 Contact with and (suspected) exposure to covid-19: Secondary | ICD-10-CM | POA: Diagnosis present

## 2019-10-11 DIAGNOSIS — Z87891 Personal history of nicotine dependence: Secondary | ICD-10-CM

## 2019-10-11 DIAGNOSIS — Z833 Family history of diabetes mellitus: Secondary | ICD-10-CM

## 2019-10-11 DIAGNOSIS — E7800 Pure hypercholesterolemia, unspecified: Secondary | ICD-10-CM | POA: Diagnosis present

## 2019-10-11 DIAGNOSIS — I10 Essential (primary) hypertension: Secondary | ICD-10-CM | POA: Diagnosis present

## 2019-10-11 DIAGNOSIS — Z79899 Other long term (current) drug therapy: Secondary | ICD-10-CM

## 2019-10-11 DIAGNOSIS — I2510 Atherosclerotic heart disease of native coronary artery without angina pectoris: Secondary | ICD-10-CM | POA: Diagnosis present

## 2019-10-11 DIAGNOSIS — I2583 Coronary atherosclerosis due to lipid rich plaque: Secondary | ICD-10-CM | POA: Diagnosis present

## 2019-10-11 DIAGNOSIS — Z8249 Family history of ischemic heart disease and other diseases of the circulatory system: Secondary | ICD-10-CM

## 2019-10-11 DIAGNOSIS — I700 Atherosclerosis of aorta: Secondary | ICD-10-CM | POA: Diagnosis present

## 2019-10-11 DIAGNOSIS — Z8379 Family history of other diseases of the digestive system: Secondary | ICD-10-CM

## 2019-10-11 DIAGNOSIS — I713 Abdominal aortic aneurysm, ruptured: Secondary | ICD-10-CM | POA: Diagnosis present

## 2019-10-11 DIAGNOSIS — M25552 Pain in left hip: Secondary | ICD-10-CM | POA: Diagnosis present

## 2019-10-11 DIAGNOSIS — E785 Hyperlipidemia, unspecified: Secondary | ICD-10-CM | POA: Diagnosis present

## 2019-10-11 LAB — CBC
HCT: 45.5 % (ref 39.0–52.0)
Hemoglobin: 15 g/dL (ref 13.0–17.0)
MCH: 29.4 pg (ref 26.0–34.0)
MCHC: 33 g/dL (ref 30.0–36.0)
MCV: 89 fL (ref 80.0–100.0)
Platelets: 268 10*3/uL (ref 150–400)
RBC: 5.11 MIL/uL (ref 4.22–5.81)
RDW: 13.5 % (ref 11.5–15.5)
WBC: 8.8 10*3/uL (ref 4.0–10.5)
nRBC: 0 % (ref 0.0–0.2)

## 2019-10-11 LAB — BASIC METABOLIC PANEL
Anion gap: 11 (ref 5–15)
BUN: 15 mg/dL (ref 8–23)
CO2: 24 mmol/L (ref 22–32)
Calcium: 9.7 mg/dL (ref 8.9–10.3)
Chloride: 106 mmol/L (ref 98–111)
Creatinine, Ser: 1.34 mg/dL — ABNORMAL HIGH (ref 0.61–1.24)
GFR calc Af Amer: >60 mL/min (ref >60)
GFR calc non Af Amer: 54 mL/min — ABNORMAL LOW (ref >60)
Glucose, Bld: 101 mg/dL — ABNORMAL HIGH (ref 70–99)
Potassium: 3.9 mmol/L (ref 3.5–5.1)
Sodium: 141 mmol/L (ref 135–145)

## 2019-10-11 LAB — TROPONIN I (HIGH SENSITIVITY)
Troponin I (High Sensitivity): 16 ng/L (ref <18)
Troponin I (High Sensitivity): 14 ng/L (ref <18)

## 2019-10-11 MED ORDER — AMLODIPINE BESYLATE 5 MG PO TABS
5.0000 mg | ORAL_TABLET | Freq: Every day | ORAL | Status: DC
  Administered 2019-10-11 – 2019-10-12 (×2): 5 mg via ORAL
  Filled 2019-10-11 (×2): qty 1

## 2019-10-11 MED ORDER — NITROGLYCERIN 0.4 MG SL SUBL
0.4000 mg | SUBLINGUAL_TABLET | SUBLINGUAL | Status: DC | PRN
  Administered 2019-10-11: 0.4 mg via SUBLINGUAL
  Filled 2019-10-11: qty 1

## 2019-10-11 MED ORDER — ASPIRIN 81 MG PO CHEW
324.0000 mg | CHEWABLE_TABLET | Freq: Once | ORAL | Status: AC
  Administered 2019-10-11: 324 mg via ORAL
  Filled 2019-10-11: qty 4

## 2019-10-11 MED ORDER — PANTOPRAZOLE SODIUM 40 MG PO TBEC
40.0000 mg | DELAYED_RELEASE_TABLET | Freq: Every day | ORAL | Status: DC
  Administered 2019-10-11 – 2019-10-13 (×3): 40 mg via ORAL
  Filled 2019-10-11 (×3): qty 1

## 2019-10-11 MED ORDER — LACTATED RINGERS IV BOLUS
1000.0000 mL | Freq: Once | INTRAVENOUS | Status: AC
  Administered 2019-10-11: 21:00:00 1000 mL via INTRAVENOUS

## 2019-10-11 MED ORDER — METOPROLOL SUCCINATE ER 25 MG PO TB24: 50.0000 mg | ORAL_TABLET | Freq: Every day | ORAL | Status: DC

## 2019-10-11 MED ORDER — ASPIRIN EC 81 MG PO TBEC
81.0000 mg | DELAYED_RELEASE_TABLET | Freq: Every day | ORAL | Status: DC
  Administered 2019-10-12 – 2019-10-13 (×2): 81 mg via ORAL
  Filled 2019-10-11 (×2): qty 1

## 2019-10-11 MED ORDER — ATORVASTATIN CALCIUM 80 MG PO TABS
80.0000 mg | ORAL_TABLET | Freq: Every day | ORAL | Status: DC
  Administered 2019-10-12: 80 mg via ORAL
  Filled 2019-10-11: qty 1

## 2019-10-11 MED ORDER — ENOXAPARIN SODIUM 40 MG/0.4ML ~~LOC~~ SOLN
40.0000 mg | SUBCUTANEOUS | Status: DC
  Administered 2019-10-11 – 2019-10-12 (×2): 40 mg via SUBCUTANEOUS
  Filled 2019-10-11 (×2): qty 0.4

## 2019-10-11 NOTE — ED Notes (Signed)
Admitting at bedside 

## 2019-10-11 NOTE — H&P (Signed)
Date: 10/11/2019               Patient Name:  Ronald Mullins MRN: [id number]  DOB: 1951-05-30 Age / Sex: 69 y.o., male   PCP: [facility]         Medical Service: Internal Medicine Teaching Service         Attending Physician: Dr. Oswaldo Done     First Contact: Ronelle Nigh, MD, Molli Hazard Pager: [phone])  Second Contact: Nedra Hai, MD, Ivin Booty Pager: Eustaquio Maize [phone])       After Hours (After 5p/  First Contact Pager: [phone]  weekends / holidays): Second Contact Pager: [phone]   Chief Complaint: Chest pain  History of Present Illness: Ronald Mullins is a 69 year old very pleasant gentleman with medical history significant for STEMI in 2016 status post PCI with DES, unstable angina, abdominal aortic aneurysm rupture status post repair in 2015, hyperlipidemia, hypertension, coronary artery disease with LHC in 2017 which revealed nonobstructive CAD who presented to the hospital with chest pain.  He reports that he was in his usual state of health until this morning when he began experiencing left-sided severe chest pain during a work conference.  He rates his initial pain at 10 out of 10 that occurred at rest.  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  He denies any radiation of his pain, dizziness, lightheadedness, nausea, vomiting, diaphoresis but does report that initially he had shortness of breath.  After he reported to the ED, he was given sublingual nitroglycerin which improved his pain.  Currently, he does endorse some mild chest pressure but much improved from his initial symptoms.  Per chart review, he had similar presentation in 2017 and had LHC which showed nonobstructive CAD.  He does mention that he is not judicious in taking all his medications and only takes them when he feels like it.   Lab Orders     SARS CORONAVIRUS 2 (TAT 6-24 HRS) Nasopharyngeal  Nasopharyngeal Swab     Basic metabolic panel     CBC     HIV Antibody (routine testing w rflx)     CBC     Basic metabolic panel   Meds:  *Amlodipine 5 mg daily *Aspirin 81 mg daily *Lipitor 80 mg daily *Toprol-XL 50 mg daily *SL nitroglycerin as needed  Allergies: Allergies as of 10/11/2019  . (No Known Allergies)   Past Medical History:  Diagnosis Date  . AAA (abdominal aortic aneurysm) without rupture (HCC)   . CAD (coronary artery disease), native coronary artery   . High cholesterol   . Hypertension   . Myocardial infarct [facility])     Family History: Brother who passed away from Covid and cancer  Social History: He currently lives in [place] with his son.  He quit tobacco use 19 years ago, quit alcohol use 19 years ago and divorced 9 years ago.  He works for [employer]  Review of Systems: A complete ROS was negative except as per HPI.   Physical Exam: Blood pressure (!) 146/87, pulse (!) 51, temperature 98.2 F (36.8 C), temperature source Oral, resp. rate 16, height 5\' 11"  (1.803 m), weight 105.7 kg, SpO2 99 %. Physical Exam  Constitutional: He is well-developed, well-nourished, and in no distress.  HENT:  Head: Normocephalic.  Eyes: Conjunctivae are normal.  Cardiovascular: Normal rate, regular rhythm and normal heart sounds. Exam reveals no friction rub.  No murmur heard. Pulmonary/Chest: Effort normal and breath sounds normal. No respiratory distress. He has no wheezes.  Abdominal: Soft. Bowel sounds are normal. He exhibits no distension. There is no abdominal tenderness.  Musculoskeletal:        General: No edema.     Cervical back: Normal range of motion.  Neurological: He is alert.  Psychiatric: Mood and affect normal.  Vitals reviewed.    EKG: personally reviewed my interpretation is sinus bradycardia with old T wave changes  CXR: personally reviewed my interpretation is unremarkable  Assessment & Plan by Problem: Active Problems:   Chest  pain  Ronald Mullins is a 69 yo M w/ a PMHx of CAD, NSTEMI status post PCI with DES, HTN, HLD and AAA here for evaluation of chest pain  #Chest Pain (typical and atypical in nature) #Unstable angina #CAD #NSTEMI status post PCI with DES He presents with symptoms that are consistent with both stable and unstable angina.  Currently, he is chest pain-free but does endorse mild chest pressure.  He had similar presentation in 2017 and underwent left heart cath which showed nonobstructive CAD.  His troponin is unremarkable x2.  His EKG showed sinus bradycardia and does not reveal any acute ST changes. He does have cold T wave inversion in V1, V5 and lead III -Admit for observation -Follow cardiology recommendations -Sublingual nitrogen as needed -Cardiac monitoring -Continue aspirin, Lipitor  HTN: -Continue amlodipine 5 mg daily -Hold Toprol-XL in light of bradycardia.  On further review, he does have chronic lower heart rate -If appropriate, start BiDil if BP tolerates  HLD: -Continue Lipitor  FEN: Heart healthy diet VTE ppx: Subcutaneous Lovenox CODE STATUS: Full code  Prior to Admission Living Arrangement: Home Anticipated Discharge Location: Home  Barriers to Discharge: Ongoing medical management  Dispo: Admit patient to Observation with expected length of stay less than 2 midnights.  Signed: Jean Rosenthal, MD 10/11/2019, 9:11 PM  Pager: [phone] Internal Medicine Teaching Service

## 2019-10-11 NOTE — ED Triage Notes (Signed)
Patient c/o central chest pain, non radiating. Describes it as sharp. Pain started about an hour and half ago but has let up.

## 2019-10-11 NOTE — Consult Note (Signed)
Cardiology Consultation:   Patient ID: Ronald Mullins MRN: [id number]; DOB: Nov 02, 1950  Admit date: 10/11/2019 Date of Consult: 10/11/2019  Primary Care Provider: Center, [facility] Primary Cardiologist: Dr Elease Hashimoto Primary Electrophysiologist:  None    Patient Profile:   Ronald Mullins is a 69 y.o. male with a hx of CAD s/p STEMI and PCI in 2016, AAA s/p rupture in 2015 w/ endovascular repair, HTN, HLD who is being seen today for the evaluation of chest pain at the request of Dr Oswaldo Done.  History of Present Illness:   Ronald Mullins reports sudden onset chest pain occurring earlier today. He was sitting in a chair participating in a video conference at the time. He states this pain felt exactly like the pain he had several years ago when he had his STEMI. The pain persisted and was 10/10 in severity. It was located in the central chest without radiation. He describes some shortness of breath at the time as well but no other associated symptoms. He came to the ED for evaluation.   On arrival to the ED, the patient's vital signs were significant for sinus bradycardia (HR's in the 40's), and hypertension (SBP 174). His ECG was negative for acute ischemic abnormality. His hsTnI were 16 and 14. He was admitted to medicine for observation. Cardiology was consulted.   Notably, the patient states that he does not have chest pain frequently at home. He has SLN tablets but he has never taken one. He is able to do all of his ADL's and IADL's without limitation. He takes 81mg  aspirin daily but has not been taking any of his other medications as he has been feeling well lately. He is followed by a cardiologist at [facility], whom he sees once per year.    On my assessment, the patient states he is still having 2/10 chest discomfort though this is much improved from earlier in the day. He received a full dose aspirin on arrival.   Heart Pathway Score:  HEAR Score: 5  Past Medical History:  Diagnosis Date    . AAA (abdominal aortic aneurysm) without rupture (HCC)   . CAD (coronary artery disease), native coronary artery   . High cholesterol   . Hypertension   . Myocardial infarct [facility])     Past Surgical History:  Procedure Laterality Date  . ABDOMINAL AORTIC ANEURYSM REPAIR    . ABDOMINAL AORTIC ENDOVASCULAR STENT GRAFT    . CARDIAC CATHETERIZATION N/A 01/29/2016   Procedure: Left Heart Cath and Coronary Angiography;  Surgeon: 03/30/2016, MD;  Location: [facility] INVASIVE CV LAB;  Service: Cardiovascular;  Laterality: N/A;  . CORONARY STENT PLACEMENT       Home Medications:  Prior to Admission medications   Medication Sig Start Date End Date Taking? Authorizing Provider  amLODipine (NORVASC) 5 MG tablet Take 5 mg by mouth daily.  05/28/18  Yes [provider]  aspirin 81 MG tablet Take 81 mg by mouth daily.   Yes [provider]  atorvastatin (LIPITOR) 80 MG tablet Take 80 mg by mouth daily at 6 PM.   Yes [provider]  metoprolol succinate (TOPROL-XL) 50 MG 24 hr tablet Take 50 mg by mouth daily.    Yes [provider]  nitroGLYCERIN (NITROSTAT) 0.4 MG SL tablet Place 1 tablet (0.4 mg total) under the tongue every 5 (five) minutes as needed for chest pain. 01/30/16  Yes Rai, Ripudeep K, MD  pantoprazole (PROTONIX) 40 MG tablet Take 1 tablet (40 mg total)  by mouth daily. Patient not taking: Reported on 10/11/2019 01/30/16   Mendel Corning, MD    Inpatient Medications: Scheduled Meds: . amLODipine  5 mg Oral Daily  . aspirin EC  81 mg Oral Daily  . [START ON 10/12/2019] atorvastatin  80 mg Oral q1800  . enoxaparin (LOVENOX) injection  40 mg Subcutaneous Q24H  . pantoprazole  40 mg Oral Daily   Continuous Infusions:  PRN Meds: nitroGLYCERIN  Allergies:   No Known Allergies  Social History:   Social History   Socioeconomic History  . Marital status: Divorced    Spouse name: Not on file  . Number of children: Not on file  . Years of  education: Not on file  . Highest education level: Not on file  Occupational History  . Not on file  Tobacco Use  . Smoking status: Former Research scientist (life sciences)  . Smokeless tobacco: Never Used  Substance and Sexual Activity  . Alcohol use: No  . Drug use: No  . Sexual activity: Not on file  Other Topics Concern  . Not on file  Social History Narrative  . Not on file   Social Determinants of Health   Financial Resource Strain:   . Difficulty of Paying Living Expenses:   Food Insecurity:   . Worried About Charity fundraiser in the Last Year:   . Arboriculturist in the Last Year:   Transportation Needs:   . Film/video editor (Medical):   Marland Kitchen Lack of Transportation (Non-Medical):   Physical Activity:   . Days of Exercise per Week:   . Minutes of Exercise per Session:   Stress:   . Feeling of Stress :   Social Connections:   . Frequency of Communication with Friends and Family:   . Frequency of Social Gatherings with Friends and Family:   . Attends Religious Services:   . Active Member of Clubs or Organizations:   . Attends Archivist Meetings:   Marland Kitchen Marital Status:   Intimate Partner Violence:   . Fear of Current or Ex-Partner:   . Emotionally Abused:   Marland Kitchen Physically Abused:   . Sexually Abused:     Family History:   Family History  Problem Relation Age of Onset  . Hypertension Mother   . Diabetes Mother   . Hypertension Father        Deceased after valve surgery  . Celiac disease Sister      ROS:  Please see the history of present illness.   All other ROS reviewed and negative.     Physical Exam/Data:   Vitals:   10/11/19 1800 10/11/19 1900 10/11/19 1915 10/11/19 1930  BP: (!) 153/95 (!) 174/93 (!) 147/88 (!) 146/87  Pulse: (!) 49 (!) 50 (!) 51 (!) 51  Resp: (!) 9 15 11 16   Temp:      TempSrc:      SpO2: 100% 99% 97% 99%  Weight:      Height:       No intake or output data in the 24 hours ending 10/11/19 2110 Last 3 Weights 10/11/2019 01/30/2016  01/29/2016  Weight (lbs) 233 lb 215 lb 9.8 oz 210 lb 4.8 oz  Weight (kg) 105.688 kg 97.8 kg 95.391 kg     Body mass index is 32.5 kg/m.  General:  Well nourished, well developed, in no acute distress HEENT: normal Lymph: no adenopathy Neck: no JVD Endocrine:  No thryomegaly Vascular: No carotid bruits; FA pulses 2+ bilaterally without  bruits  Cardiac:  normal S1, S2; RRR; 2/6 systolic murmur heard best at the cardiac apex Lungs:  clear to auscultation bilaterally, no wheezing, rhonchi or rales  Abd: soft, nontender, no hepatomegaly  Ext: no edema Musculoskeletal:  No deformities, BUE and BLE strength normal and equal Skin: warm and dry  Neuro:  CNs 2-12 intact, no focal abnormalities noted Psych:  Normal affect   EKG:  The EKG was personally reviewed and demonstrates:  Sinus bradycardia, HR 48, low voltage in precordial leads, no acute ST or T wave changes to suggest ischemia  Relevant CV Studies: 01/2016 Coronary Angiography   Prior second diagonal stent is widely patent.  Mid LAD lesion, 50 %stenosed.  Dist Cx-1 lesion, 20 %stenosed.  Distal Cx stent is widely patent.  2nd Mrg-1 lesion, 20 %stenosed.  OM2 stent is widely patent.  The left ventricular systolic function is normal.  LV end diastolic pressure is normal.  The left ventricular ejection fraction is 55-65% by visual estimate.   Plan: 1.  Continue dual antiplatelet therapy and aggressive secondary prevention. 2.  Medical therapy for atypical chest pain with non-obstructive coronary artery disease.  Laboratory Data:  High Sensitivity Troponin:   Recent Labs  Lab 10/11/19 1107 10/11/19 1655  TROPONINIHS 16 14     Chemistry Recent Labs  Lab 10/11/19 1107  NA 141  K 3.9  CL 106  CO2 24  GLUCOSE 101*  BUN 15  CREATININE 1.34*  CALCIUM 9.7  GFRNONAA 54*  GFRAA >60  ANIONGAP 11    No results for input(s): PROT, ALBUMIN, AST, ALT, ALKPHOS, BILITOT in the last 168 hours. Hematology Recent  Labs  Lab 10/11/19 1107  WBC 8.8  RBC 5.11  HGB 15.0  HCT 45.5  MCV 89.0  MCH 29.4  MCHC 33.0  RDW 13.5  PLT 268   BNPNo results for input(s): BNP, PROBNP in the last 168 hours.  DDimer No results for input(s): DDIMER in the last 168 hours.   Radiology/Studies:  DG Chest 2 View  Result Date: 10/11/2019 CLINICAL DATA:  Chest pain EXAM: CHEST - 2 VIEW COMPARISON:  09/24/2017 FINDINGS: The heart size and mediastinal contours are within normal limits. Minimal atherosclerotic calcification of the aortic knob. No focal airspace consolidation, pleural effusion, or pneumothorax. The visualized skeletal structures are unremarkable. IMPRESSION: No active cardiopulmonary disease. Electronically Signed   By: Duanne Guess D.O.   On: 10/11/2019 11:34       HEAR Score (for undifferentiated chest pain):  HEAR Score: 5    Assessment and Plan:  Ronald Mullins is a 69 y.o. male with a hx of CAD s/p STEMI and PCI in 2016, AAA s/p rupture in 2015 w/ endovascular repair, HTN, HLD who presents with chest pain. Reassuringly his troponin testing has been negative for ACS and his ECG is without acute abnormality. He does however have several concerning symptoms, including the quality of his chest pain being similar to his prior MI and the fact that he continues to have resting chest pain. His pain has been relieved with nitroglycerin. Given his known CAD and risk factors, it would be reasonable to pursue a coronary evaluation. Coronary CTA may be an option, however this would only be diagnostic if his prior stents are >54mm in size. I am not able to see the size of the stents that he had placed on review of his outside hospital records through [facility]. If coronary CTA is not an option, the decision will have to be  made to pursue functional evaluation/stress testing vs diagnostic angiography. The cardiology consult team will see the patient in the AM and decide on the best next steps for workup.   #)  Chest pain: - agree with admission for observation - check echo in AM - aspirin 81mg  daily - check lipid panel and A1c - restart home atorvastatin 80mg  daily and amlodipine  - SLN PRN - if chest pain persists, would recommend starting heparin drip and treating as unstable angina/ACS   For questions or updates, please contact [facility] Please consult [email or web address] for contact info under    Signed, , MD  10/11/2019 9:10 PM

## 2019-10-11 NOTE — ED Notes (Signed)
No chest pain

## 2019-10-11 NOTE — ED Provider Notes (Signed)
[facility] EMERGENCY DEPARTMENT Provider Note   CSN: [id number] Arrival date & time: 10/11/19  1038     History Chief Complaint  Patient presents with  . Chest Pain    Ronald Mullins is a 69 y.o. male with past medical history of unstable angina, previous STEMI in 2016 s/p DES,  AAA s/p rupture in 2015 and endorepair, hyperlipidemia, hypertension, coronary artery disease.  His last coronary catheter in 2017.  He states that he started having  central chest pain at 10 AM this morning that felt like his previous MI.  He states that he was just sitting at his computer when it started.  He denies chest pain since his previous MI and has never had to use his Nitropaste that was given to him.  He states that he did have associated shortness of breath when he was having his chest pain.  His chest pain went away about midday but has now returned, his shortness of of breath has resolved.  He describes the pain as sharp and is currently 5/10 without radiation.  He denies any associated numbness/tingling, weakness, headache, dizziness, nausea, vomiting, fevers, diaphoresis, chills, neck pain, arm pain, abdominal pain, back pain.  He does report some left hip pain that has been ongoing for multiple years.  He states that he quit smoking about 18 years ago and denies any alcohol or drug use.  He has but both of his Covid vaccines and states that he has not been around anyone with Covid.  He has not taken anything for his pain today.  He states that he takes his blood pressure medication about 5 times a week because he forgets the other days.  He has no history of CHF, diabetes, COPD.  HPI    Past Medical History:  Diagnosis Date  . AAA (abdominal aortic aneurysm) without rupture ([place])   . CAD (coronary artery disease), native coronary artery   . High cholesterol   . Hypertension   . Myocardial infarct [facility])     Patient Active Problem List   Diagnosis Date Noted  . AAA (abdominal  aortic aneurysm) without rupture ([place]) 01/29/2016  . Essential hypertension 01/29/2016  . Coronary arteriosclerosis due to lipid rich plaque 01/29/2016  . Dissecting AAA (abdominal aortic aneurysm) ([place])   . Unstable angina ([place])   . Pain in the chest 01/28/2016    Past Surgical History:  Procedure Laterality Date  . ABDOMINAL AORTIC ANEURYSM REPAIR    . ABDOMINAL AORTIC ENDOVASCULAR STENT GRAFT    . CARDIAC CATHETERIZATION N/A 01/29/2016   Procedure: Left Heart Cath and Coronary Angiography;  Surgeon: Jettie Booze, MD;  Location: [place] CV LAB;  Service: Cardiovascular;  Laterality: N/A;  . CORONARY STENT PLACEMENT         Family History  Problem Relation Age of Onset  . Hypertension Mother   . Diabetes Mother   . Hypertension Father        Deceased after valve surgery  . Celiac disease Sister     Social History   Tobacco Use  . Smoking status: Former Research scientist (life sciences)  . Smokeless tobacco: Never Used  Substance Use Topics  . Alcohol use: No  . Drug use: No    Home Medications Prior to Admission medications   Medication Sig Start Date End Date Taking? Authorizing Provider  aspirin 81 MG tablet Take 81 mg by mouth daily.    [provider]  atorvastatin (LIPITOR) 80 MG tablet Take 80 mg by  mouth daily at 6 PM.    [provider]  carvedilol (COREG) 3.125 MG tablet Take 3.125 mg by mouth 2 (two) times daily with a meal.    [provider]  gabapentin (NEURONTIN) 300 MG capsule Take 300 mg by mouth 2 (two) times daily.    [provider]  lisinopril (PRINIVIL,ZESTRIL) 5 MG tablet Take 5 mg by mouth daily.    [provider]  nitroGLYCERIN (NITROSTAT) 0.4 MG SL tablet Place 1 tablet (0.4 mg total) under the tongue every 5 (five) minutes as needed for chest pain. 01/30/16   Rai, Ripudeep K, MD  pantoprazole (PROTONIX) 40 MG tablet Take 1 tablet (40 mg total) by mouth daily. 01/30/16   Rai, Ripudeep K, MD  prasugrel (EFFIENT) 10 MG  TABS tablet Take 10 mg by mouth daily.    [provider]    Allergies    Patient has no known allergies.  Review of Systems   Review of Systems  Cardiovascular: Positive for chest pain.   Ten systems are reviewed by me and are negative for acute changes, except as noted in the HPI.  Physical Exam Updated Vital Signs BP (!) 169/90 (BP Location: Left Arm)   Pulse 66   Temp 98.2 F (36.8 C) (Oral)   Resp 16   Ht 5\' 11"  (1.803 m)   Wt 105.7 kg   SpO2 100%   BMI 32.50 kg/m   Physical Exam .PE: Constitutional: well-developed, well-nourished, no apparent distress, pt is not clutching chest HENT: normocephalic, atraumatic Cardiovascular: normal rate and rhythm, distal pulses intact Pulmonary/Chest: effort normal; breath sounds clear and equal bilaterally; no wheezes or rales Abdominal: soft and nontender Musculoskeletal: full ROM, no edema Lymphadenopathy: no cervical adenopathy Neurological: alert with goal directed thinking Skin: warm and dry, no rash, no diaphoresis Psychiatric: normal mood and affect, normal behavior   ED Results / Procedures / Treatments   Labs (all labs ordered are listed, but only abnormal results are displayed) Labs Reviewed  BASIC METABOLIC PANEL - Abnormal; Notable for the following components:      Result Value   Glucose, Bld 101 (*)    Creatinine, Ser 1.34 (*)    GFR calc non Af Amer 54 (*)    All other components within normal limits  CBC  TROPONIN I (HIGH SENSITIVITY)  TROPONIN I (HIGH SENSITIVITY)    EKG EKG Interpretation  Date/Time:  Friday October 11 2019 10:42:55 EDT Ventricular Rate:  62 PR Interval:  138 QRS Duration: 82 QT Interval:  420 QTC Calculation: 426 R Axis:   41 Text Interpretation: Normal sinus rhythm Septal infarct , age undetermined Abnormal ECG nonspecific inferior changes since prior 8/17 Confirmed by 9/17 [phone]) on 10/11/2019 4:48:55 PM   Radiology DG Chest 2 View  Result Date:  10/11/2019 CLINICAL DATA:  Chest pain EXAM: CHEST - 2 VIEW COMPARISON:  09/24/2017 FINDINGS: The heart size and mediastinal contours are within normal limits. Minimal atherosclerotic calcification of the aortic knob. No focal airspace consolidation, pleural effusion, or pneumothorax. The visualized skeletal structures are unremarkable. IMPRESSION: No active cardiopulmonary disease. Electronically Signed   By: 09/26/2017 D.O.   On: 10/11/2019 11:34    Procedures Procedures (including critical care time)  Medications Ordered in ED Medications  aspirin chewable tablet 324 mg (has no administration in time range)  nitroGLYCERIN (NITROSTAT) SL tablet 0.4 mg (has no administration in time range)    ED Course  I have reviewed the triage vital signs and  the nursing notes.  Pertinent labs & imaging results that were available during my care of the patient were reviewed by me and considered in my medical decision making (see chart for details).  Clinical Course as of Oct 11 1726  Fri Oct 11, 2019  [id number] 70 year old male with known coronary disease here with acute onset of sharp substernal chest pain around 10 AM this morning that is now turned into just pressure.  Similar to his prior cardiac event.  EKG was nonspecific and his first troponin was 16.   [MB]    Clinical Course User Index [MB] Terrilee Files, MD   MDM Rules/Calculators/A&P HEAR Score: 5                     Keeven Matty is a 69 y.o. male with past medical history of unstable angina, previous STEMI in 2016 s/p DES,  AAA s/p rupture in 2015 and endorepair, hyperlipidemia, hypertension, coronary artery disease.  His last coronary catheter in 2017.  He has had 2 troponins in the emergency room which were negative.  Normal chest x-ray and blood work.  HEAR score 5 which recommends observation versus close follow-up.  Patient reassessed.  He states that his chest pain was not resolved with nitro/aspirin..  Presentation concerning  for unstable angina due to risk factors and patient stating that his chest pain feels like his previous MI.  I will consult internal medicine because I would like to have him come in for observation.  Spoke to Dr. Dortha Schwalbe who will admit for observation.  I discussed this case with my attending physician, Dr. Charm Barges, who cosigned this note including patient's presenting symptoms, physical exam, and planned diagnostics and interventions. Attending physician stated agreement with plan or made changes to plan which were implemented. Attending physician assessed patient at bedside.     Final Clinical Impression(s) / ED Diagnoses Final diagnoses:  None    Rx / DC Orders ED Discharge Orders    None       Farrel Gordon, PA-C 10/11/19 2018    Terrilee Files, MD 10/11/19 2233

## 2019-10-12 ENCOUNTER — Observation Stay (HOSPITAL_COMMUNITY): Payer: Medicare HMO

## 2019-10-12 ENCOUNTER — Encounter (HOSPITAL_COMMUNITY): Payer: Self-pay | Admitting: Student in an Organized Health Care Education/Training Program

## 2019-10-12 DIAGNOSIS — Z8679 Personal history of other diseases of the circulatory system: Secondary | ICD-10-CM | POA: Diagnosis not present

## 2019-10-12 DIAGNOSIS — Z7982 Long term (current) use of aspirin: Secondary | ICD-10-CM | POA: Diagnosis not present

## 2019-10-12 DIAGNOSIS — I10 Essential (primary) hypertension: Secondary | ICD-10-CM | POA: Diagnosis present

## 2019-10-12 DIAGNOSIS — R079 Chest pain, unspecified: Secondary | ICD-10-CM

## 2019-10-12 DIAGNOSIS — I713 Abdominal aortic aneurysm, ruptured: Secondary | ICD-10-CM | POA: Diagnosis present

## 2019-10-12 DIAGNOSIS — E785 Hyperlipidemia, unspecified: Secondary | ICD-10-CM | POA: Diagnosis present

## 2019-10-12 DIAGNOSIS — M25552 Pain in left hip: Secondary | ICD-10-CM | POA: Diagnosis present

## 2019-10-12 DIAGNOSIS — Z8379 Family history of other diseases of the digestive system: Secondary | ICD-10-CM | POA: Diagnosis not present

## 2019-10-12 DIAGNOSIS — I2583 Coronary atherosclerosis due to lipid rich plaque: Secondary | ICD-10-CM

## 2019-10-12 DIAGNOSIS — Z20822 Contact with and (suspected) exposure to covid-19: Secondary | ICD-10-CM | POA: Diagnosis present

## 2019-10-12 DIAGNOSIS — I25110 Atherosclerotic heart disease of native coronary artery with unstable angina pectoris: Secondary | ICD-10-CM | POA: Diagnosis present

## 2019-10-12 DIAGNOSIS — E7800 Pure hypercholesterolemia, unspecified: Secondary | ICD-10-CM | POA: Diagnosis present

## 2019-10-12 DIAGNOSIS — Z955 Presence of coronary angioplasty implant and graft: Secondary | ICD-10-CM | POA: Diagnosis not present

## 2019-10-12 DIAGNOSIS — I2510 Atherosclerotic heart disease of native coronary artery without angina pectoris: Secondary | ICD-10-CM

## 2019-10-12 DIAGNOSIS — R001 Bradycardia, unspecified: Secondary | ICD-10-CM | POA: Diagnosis present

## 2019-10-12 DIAGNOSIS — I252 Old myocardial infarction: Secondary | ICD-10-CM | POA: Diagnosis not present

## 2019-10-12 DIAGNOSIS — I700 Atherosclerosis of aorta: Secondary | ICD-10-CM | POA: Diagnosis present

## 2019-10-12 DIAGNOSIS — Z833 Family history of diabetes mellitus: Secondary | ICD-10-CM | POA: Diagnosis not present

## 2019-10-12 DIAGNOSIS — Z8249 Family history of ischemic heart disease and other diseases of the circulatory system: Secondary | ICD-10-CM | POA: Diagnosis not present

## 2019-10-12 DIAGNOSIS — Z79899 Other long term (current) drug therapy: Secondary | ICD-10-CM | POA: Diagnosis not present

## 2019-10-12 DIAGNOSIS — I708 Atherosclerosis of other arteries: Secondary | ICD-10-CM | POA: Diagnosis present

## 2019-10-12 DIAGNOSIS — Z87891 Personal history of nicotine dependence: Secondary | ICD-10-CM | POA: Diagnosis not present

## 2019-10-12 LAB — BASIC METABOLIC PANEL
Anion gap: 6 (ref 5–15)
BUN: 13 mg/dL (ref 8–23)
CO2: 28 mmol/L (ref 22–32)
Calcium: 9.4 mg/dL (ref 8.9–10.3)
Chloride: 105 mmol/L (ref 98–111)
Creatinine, Ser: 1.37 mg/dL — ABNORMAL HIGH (ref 0.61–1.24)
GFR calc Af Amer: >60 mL/min (ref >60)
GFR calc non Af Amer: 53 mL/min — ABNORMAL LOW (ref >60)
Glucose, Bld: 83 mg/dL (ref 70–99)
Potassium: 4.4 mmol/L (ref 3.5–5.1)
Sodium: 139 mmol/L (ref 135–145)

## 2019-10-12 LAB — CBC
HCT: 45.5 % (ref 39.0–52.0)
Hemoglobin: 15 g/dL (ref 13.0–17.0)
MCH: 29.3 pg (ref 26.0–34.0)
MCHC: 33 g/dL (ref 30.0–36.0)
MCV: 88.9 fL (ref 80.0–100.0)
Platelets: 242 10*3/uL (ref 150–400)
RBC: 5.12 MIL/uL (ref 4.22–5.81)
RDW: 13.6 % (ref 11.5–15.5)
WBC: 7 10*3/uL (ref 4.0–10.5)
nRBC: 0 % (ref 0.0–0.2)

## 2019-10-12 LAB — LIPID PANEL
Cholesterol: 171 mg/dL (ref 0–200)
HDL: 33 mg/dL — ABNORMAL LOW (ref >40)
LDL Cholesterol: 107 mg/dL — ABNORMAL HIGH (ref 0–99)
Total CHOL/HDL Ratio: 5.2 RATIO
Triglycerides: 156 mg/dL — ABNORMAL HIGH (ref <150)
VLDL: 31 mg/dL (ref 0–40)

## 2019-10-12 LAB — ECHOCARDIOGRAM COMPLETE
Height: 71 in
Weight: 3617.6 oz

## 2019-10-12 LAB — HEMOGLOBIN A1C
Hgb A1c MFr Bld: 6.1 % — ABNORMAL HIGH (ref 4.8–5.6)
Mean Plasma Glucose: 128.37 mg/dL

## 2019-10-12 LAB — HIV ANTIBODY (ROUTINE TESTING W REFLEX): HIV Screen 4th Generation wRfx: NONREACTIVE

## 2019-10-12 LAB — SARS CORONAVIRUS 2 (TAT 6-24 HRS): SARS Coronavirus 2: NEGATIVE

## 2019-10-12 MED ORDER — PERFLUTREN LIPID MICROSPHERE
1.0000 mL | INTRAVENOUS | Status: AC | PRN
  Administered 2019-10-12: 3 mL via INTRAVENOUS
  Filled 2019-10-12: qty 10

## 2019-10-12 MED ORDER — LACTATED RINGERS IV BOLUS
1000.0000 mL | Freq: Once | INTRAVENOUS | Status: AC
  Administered 2019-10-12: 1000 mL via INTRAVENOUS

## 2019-10-12 MED ORDER — AMLODIPINE BESYLATE 10 MG PO TABS
10.0000 mg | ORAL_TABLET | Freq: Every day | ORAL | Status: DC
  Administered 2019-10-13: 10 mg via ORAL
  Filled 2019-10-12: qty 1

## 2019-10-12 MED ORDER — TECHNETIUM TC 99M TETROFOSMIN IV KIT
11.0000 | PACK | Freq: Once | INTRAVENOUS | Status: AC | PRN
  Administered 2019-10-12: 11 via INTRAVENOUS

## 2019-10-12 MED ORDER — IOHEXOL 350 MG/ML SOLN
80.0000 mL | Freq: Once | INTRAVENOUS | Status: AC | PRN
  Administered 2019-10-12: 80 mL via INTRAVENOUS

## 2019-10-12 NOTE — ED Notes (Signed)
Pt ambulatory to in-room restroom, NAD noted. Warm blanket provided per request upon pt's return to bed. No additional requests made at this time.

## 2019-10-12 NOTE — Progress Notes (Signed)
  Echocardiogram 2D Echocardiogram has been performed with Def[facility].  [name] 10/12/2019, 2:08 PM

## 2019-10-12 NOTE — Procedures (Signed)
Echo attempted. Patient being transported to nuclear medicine. Will attempt again later.

## 2019-10-12 NOTE — ED Notes (Signed)
Pt is sinus brady on monitor 

## 2019-10-12 NOTE — ED Notes (Signed)
Pt transported to stress test

## 2019-10-12 NOTE — Progress Notes (Signed)
   Subjective:  Ronald Mullins is a 69 y.o. with PMH of CAD s/p PCI, HTN, HLD, ruptured AAA s/p repair admit for chest pain on hospital day 0  Ronald Mullins was examined and evaluated at bedside in the ED. He provides additional history stating he was at his usual state of health attending seminars for stress management when he developed acute onset chest pain that felt similar to his prior MI. He states overnight his chest pain resolved and he has no acute complaints at the moment. Discussed plan to perform additional testing with echocardiogram. All other questions and concerns addressed.  Objective:  Vital signs in last 24 hours: Vitals:   10/12/19 1000 10/12/19 1113 10/12/19 1115 10/12/19 1206  BP: (!) 163/87  (!) 157/84 (!) 147/89  Pulse: (!) 56 (!) 52 (!) 58 64  Resp: 16 14 15 19   Temp:    98.1 F (36.7 C)  TempSrc:    Oral  SpO2: 96% 97% 96%   Weight:    102.6 kg  Height:    5\' 11"  (1.803 m)   Gen: Well-developed, well nourished, NAD HEENT: NCAT head, hearing intact, EOMI Neck: supple, no JVD CV: Bradycardic, regular rhythm, S1, S2 normal, No rubs, no murmurs, no gallops Pulm: CTAB, No rales, no wheezes Abd: Soft, BS+, NTND Extm: ROM intact, Peripheral pulses intact, No peripheral edema Skin: Dry, Warm  Assessment/Plan:  Principal Problem:   Chest pain Active Problems:   Essential hypertension   Coronary arteriosclerosis due to lipid rich plaque  Ronald Mullins is a 69 y.o. with PMH of CAD s/p PCI, HTN, HLD, ruptured AAA s/p repair admit for atypical chest pain  Atypical Chest Pain Acute onset substernal chest pain relieved by nitroglycerin at rest. Prior hx of MI and CAD s/p PCI. Troponins <20 x2. EKG w/o ischemic changes. ACS ruled out. Chest CT w/o evidence of dissection. Could be angina in light of complicated cardiac hx. Cardiology on board. Currently vitals stable, chest pain free. - F/u Echocardiogram - Appreciate cardiology recs: Pharmacological stress test - Holding  metoprolol in setting of bradycardia  (HR 50-60s) - C/w home meds: asa 81mg  daily, atorvastatin 80mg  daily, nitroglycerin 0.4mg  PRN  HTN Am bp 147/89. On amlodipine and metoprolol at home - Amlodipine dose increased per cardiology (c/w 10mg  daily)  DVT prophx: Lovenox Diet: HH, NPO at midnight Bowel: Miralax Code: Full  Dispo: Anticipated discharge in approximately 1-2 day(s).   Ronald Adas, MD 10/12/2019, 1:19 PM Pager: [phone]

## 2019-10-12 NOTE — ED Notes (Signed)
Pt transported to CT ?

## 2019-10-12 NOTE — ED Notes (Signed)
Tele   bfast ordered  

## 2019-10-12 NOTE — Progress Notes (Addendum)
Progress Note  Patient Name: Ronald Mullins Date of Encounter: 10/12/2019  Primary Cardiologist: No primary care provider on file.   Subjective  Troponin 16 ->14.  Creatinine stable at 1.37.  Pulse remains in 40s.  BP elevated to 155/82 this morning.  Denies any chest pain this morning.  Inpatient Medications    Scheduled Meds: . amLODipine  5 mg Oral Daily  . aspirin EC  81 mg Oral Daily  . atorvastatin  80 mg Oral q1800  . enoxaparin (LOVENOX) injection  40 mg Subcutaneous Q24H  . pantoprazole  40 mg Oral Daily   Continuous Infusions:  PRN Meds: nitroGLYCERIN   Vital Signs    Vitals:   10/12/19 0430 10/12/19 0500 10/12/19 0700 10/12/19 0800  BP: (!) 148/82 (!) 144/86 (!) 180/81 (!) 155/82  Pulse: (!) 45 (!) 49 (!) 48 (!) 47  Resp: 11 14 12 12   Temp:      TempSrc:      SpO2: 95% 100% 97% 96%  Weight:      Height:       No intake or output data in the 24 hours ending 10/12/19 0904 Last 3 Weights 10/11/2019 01/30/2016 01/29/2016  Weight (lbs) 233 lb 215 lb 9.8 oz 210 lb 4.8 oz  Weight (kg) 105.688 kg 97.8 kg 95.391 kg      Telemetry    Sinsu bradycardia with rate 40s - Personally Reviewed  ECG    Sinus bradycardia, rate 47, no ST/T abnormalities- Personally Reviewed  Physical Exam   GEN: No acute distress.   Neck: No JVD Cardiac: RRR, 2/6 systolic murmur Respiratory: Clear to auscultation bilaterally. GI: Soft, nontender, non-distended  MS: No edema; No deformity. Neuro:  Nonfocal  Psych: Normal affect   Labs    High Sensitivity Troponin:   Recent Labs  Lab 10/11/19 1107 10/11/19 1655  TROPONINIHS 16 14      Chemistry Recent Labs  Lab 10/11/19 1107 10/12/19 0421  NA 141 139  K 3.9 4.4  CL 106 105  CO2 24 28  GLUCOSE 101* 83  BUN 15 13  CREATININE 1.34* 1.37*  CALCIUM 9.7 9.4  GFRNONAA 54* 53*  GFRAA >60 >60  ANIONGAP 11 6     Hematology Recent Labs  Lab 10/11/19 1107 10/12/19 0421  WBC 8.8 7.0  RBC 5.11 5.12  HGB 15.0 15.0    HCT 45.5 45.5  MCV 89.0 88.9  MCH 29.4 29.3  MCHC 33.0 33.0  RDW 13.5 13.6  PLT 268 242    BNPNo results for input(s): BNP, PROBNP in the last 168 hours.   DDimer No results for input(s): DDIMER in the last 168 hours.   Radiology    DG Chest 2 View  Result Date: 10/11/2019 CLINICAL DATA:  Chest pain EXAM: CHEST - 2 VIEW COMPARISON:  09/24/2017 FINDINGS: The heart size and mediastinal contours are within normal limits. Minimal atherosclerotic calcification of the aortic knob. No focal airspace consolidation, pleural effusion, or pneumothorax. The visualized skeletal structures are unremarkable. IMPRESSION: No active cardiopulmonary disease. Electronically Signed   By: Davina Poke D.O.   On: 10/11/2019 11:34    Cardiac Studies   Cath 01/29/16:  Prior second diagonal stent is widely patent.  Mid LAD lesion, 50 %stenosed.  Dist Cx-1 lesion, 20 %stenosed.  Distal Cx stent is widely patent.  2nd Mrg-1 lesion, 20 %stenosed.  OM2 stent is widely patent.  The left ventricular systolic function is normal.  LV end diastolic pressure is normal.  The  left ventricular ejection fraction is 55-65% by visual estimate.   Plan: 1.  Continue dual antiplatelet therapy and aggressive secondary prevention. 2.  Medical therapy for atypical chest pain with non-obstructive coronary artery disease.   Patient Profile     69 y.o. male with a hx of CAD s/p STEMI and PCI in 2016, AAA s/p rupture in 2015 w/ endovascular repair, HTN, HLD who presents with chest pain  Assessment & Plan    Chest pain: History of STEMI in 2016, has had multiple stents.  Last cath in 2017 showed patent D2 stent, patent distal LCx stent, patent OM2 stent, normal LV systolic function.  Presented with substernal chest pressure, lasted for about 1 hour, with HsTn 16->14. Chest CTA with no evidence of aortic dissection - aspirin 81mg  daily - atorvastatin 80mg  daily - continue amlodipine, will increase dose to 10  mg daily  - SLN PRN -TTE - Lexiscan Myoview: spoke with nuclear, will do resting images today and plan for stress tomorrow.  Will make NPO at [place]   Hypertension: BP elevated, will increase amlodipine as above  Sinus bradycardia: resting HR in 40s, hold home toprol XL  For questions or updates, please contact [facility] Please consult [email or web address] for contact info under        Signed, , MD  10/12/2019, 9:04 AM

## 2019-10-12 NOTE — ED Notes (Signed)
Pt's HOB lowered, lights dimmed for comfort.

## 2019-10-13 DIAGNOSIS — R079 Chest pain, unspecified: Secondary | ICD-10-CM | POA: Diagnosis not present

## 2019-10-13 DIAGNOSIS — I2583 Coronary atherosclerosis due to lipid rich plaque: Secondary | ICD-10-CM | POA: Diagnosis not present

## 2019-10-13 DIAGNOSIS — I2510 Atherosclerotic heart disease of native coronary artery without angina pectoris: Secondary | ICD-10-CM | POA: Diagnosis not present

## 2019-10-13 DIAGNOSIS — I10 Essential (primary) hypertension: Secondary | ICD-10-CM | POA: Diagnosis not present

## 2019-10-13 LAB — NM MYOCAR MULTI W/SPECT W/WALL MOTION / EF
LV dias vol: 127 mL (ref 62–150)
LV sys vol: 77 mL
Peak HR: 76 {beats}/min
Rest HR: 56 {beats}/min
TID: 1.15

## 2019-10-13 LAB — CBC
HCT: 45.9 % (ref 39.0–52.0)
Hemoglobin: 15.2 g/dL (ref 13.0–17.0)
MCH: 28.8 pg (ref 26.0–34.0)
MCHC: 33.1 g/dL (ref 30.0–36.0)
MCV: 87.1 fL (ref 80.0–100.0)
Platelets: 253 10*3/uL (ref 150–400)
RBC: 5.27 MIL/uL (ref 4.22–5.81)
RDW: 13.5 % (ref 11.5–15.5)
WBC: 8.6 10*3/uL (ref 4.0–10.5)
nRBC: 0 % (ref 0.0–0.2)

## 2019-10-13 LAB — BASIC METABOLIC PANEL
Anion gap: 8 (ref 5–15)
BUN: 17 mg/dL (ref 8–23)
CO2: 24 mmol/L (ref 22–32)
Calcium: 9.7 mg/dL (ref 8.9–10.3)
Chloride: 106 mmol/L (ref 98–111)
Creatinine, Ser: 1.42 mg/dL — ABNORMAL HIGH (ref 0.61–1.24)
GFR calc Af Amer: 59 mL/min — ABNORMAL LOW (ref >60)
GFR calc non Af Amer: 51 mL/min — ABNORMAL LOW (ref >60)
Glucose, Bld: 110 mg/dL — ABNORMAL HIGH (ref 70–99)
Potassium: 4 mmol/L (ref 3.5–5.1)
Sodium: 138 mmol/L (ref 135–145)

## 2019-10-13 MED ORDER — REGADENOSON 0.4 MG/5ML IV SOLN
INTRAVENOUS | Status: AC
  Filled 2019-10-13: qty 5

## 2019-10-13 MED ORDER — TECHNETIUM TC 99M TETROFOSMIN IV KIT
30.6000 | PACK | Freq: Once | INTRAVENOUS | Status: AC | PRN
  Administered 2019-10-13: 10:00:00 30.6 via INTRAVENOUS

## 2019-10-13 MED ORDER — REGADENOSON 0.4 MG/5ML IV SOLN
0.4000 mg | Freq: Once | INTRAVENOUS | Status: AC
  Administered 2019-10-13: 0.4 mg via INTRAVENOUS
  Filled 2019-10-13: qty 5

## 2019-10-13 NOTE — Progress Notes (Addendum)
Progress Note  Patient Name: Ronald Mullins Date of Encounter: 10/13/2019  Primary Cardiologist: Kristeen MissPhilip Nahser, MD   Subjective   Patient seen in nuc med. No chest pain overnight. Breathing stable. Patient tolerated study well.   Inpatient Medications    Scheduled Meds: . regadenoson      . amLODipine  10 mg Oral Daily  . aspirin EC  81 mg Oral Daily  . atorvastatin  80 mg Oral q1800  . enoxaparin (LOVENOX) injection  40 mg Subcutaneous Q24H  . pantoprazole  40 mg Oral Daily   Continuous Infusions:  PRN Meds: nitroGLYCERIN   Vital Signs    Vitals:   10/13/19 0909 10/13/19 0941 10/13/19 0942 10/13/19 0945  BP: (!) 156/83 (!) 154/89 (!) 156/86 (!) 152/90  Pulse:      Resp:      Temp:      TempSrc:      SpO2:      Weight:      Height:       No intake or output data in the 24 hours ending 10/13/19 0957 Last 3 Weights 10/13/2019 10/12/2019 10/11/2019  Weight (lbs) 224 lb 13.9 oz 226 lb 1.6 oz 233 lb  Weight (kg) 102 kg 102.558 kg 105.688 kg      Telemetry    N/A - Personally Reviewed  ECG    Sinus brady with TWI aVL and I - Personally Reviewed  Physical Exam   GEN: No acute distress.   Neck: No JVD Cardiac: RRR, no murmurs, rubs, or gallops.  Respiratory: Clear to auscultation bilaterally. GI: Soft, nontender, non-distended  MS: No edema; No deformity. Neuro:  Nonfocal  Psych: Normal affect   Labs    High Sensitivity Troponin:   Recent Labs  Lab 10/11/19 1107 10/11/19 1655  TROPONINIHS 16 14      Chemistry Recent Labs  Lab 10/11/19 1107 10/12/19 0421 10/13/19 0348  NA 141 139 138  K 3.9 4.4 4.0  CL 106 105 106  CO2 24 28 24   GLUCOSE 101* 83 110*  BUN 15 13 17   CREATININE 1.34* 1.37* 1.42*  CALCIUM 9.7 9.4 9.7  GFRNONAA 54* 53* 51*  GFRAA >60 >60 59*  ANIONGAP 11 6 8      Hematology Recent Labs  Lab 10/11/19 1107 10/12/19 0421 10/13/19 0348  WBC 8.8 7.0 8.6  RBC 5.11 5.12 5.27  HGB 15.0 15.0 15.2  HCT 45.5 45.5 45.9  MCV  89.0 88.9 87.1  MCH 29.4 29.3 28.8  MCHC 33.0 33.0 33.1  RDW 13.5 13.6 13.5  PLT 268 242 253    BNPNo results for input(s): BNP, PROBNP in the last 168 hours.   DDimer No results for input(s): DDIMER in the last 168 hours.   Radiology    DG Chest 2 View  Result Date: 10/11/2019 CLINICAL DATA:  Chest pain EXAM: CHEST - 2 VIEW COMPARISON:  09/24/2017 FINDINGS: The heart size and mediastinal contours are within normal limits. Minimal atherosclerotic calcification of the aortic knob. No focal airspace consolidation, pleural effusion, or pneumothorax. The visualized skeletal structures are unremarkable. IMPRESSION: No active cardiopulmonary disease. Electronically Signed   By: Duanne GuessNicholas  Plundo D.O.   On: 10/11/2019 11:34   CT ANGIO CHEST AORTA W/CM & OR WO/CM  Result Date: 10/12/2019 CLINICAL DATA:  Chest pain. Evaluate for aortic dissection. Centralized chest pain since yesterday. EXAM: CT ANGIOGRAPHY CHEST WITH CONTRAST TECHNIQUE: Multidetector CT imaging of the chest was performed using the standard protocol during bolus administration of intravenous  contrast. Multiplanar CT image reconstructions and MIPs were obtained to evaluate the vascular anatomy. CONTRAST:  63mL OMNIPAQUE IOHEXOL 350 MG/ML SOLN COMPARISON:  Chest radiograph 10/11/2019. CTA chest 01/28/2016. CTA chest of 09/24/2017 from [facility] also reviewed. FINDINGS: Cardiovascular:  No intramural hematoma on noncontrast exam. Aortic and branch vessel atherosclerosis. Left subclavian artery stenosis has been detailed previously. Example coronal image 67. No aortic dissection. No aneurysm. Tortuous thoracic aorta. Moderate cardiomegaly with multivessel coronary artery atherosclerosis. No pericardial effusion. Pulmonary artery enlargement, outflow tract 3.3 cm Mediastinum/Nodes: No supraclavicular adenopathy. No mediastinal adenopathy. Right hilar nodes are likely related to old granulomatous disease. Lungs/Pleura: No pleural fluid.  Posterior right upper lobe calcified granuloma. Upper Abdomen: Normal imaged portions of the liver, spleen, stomach, gallbladder, adrenal glands, kidneys. Fatty replacement within the pancreatic head and uncinate process. Musculoskeletal: Remote right rib fractures. Lower cervical and upper thoracic spondylosis Review of the MIP images confirms the above findings. IMPRESSION: 1. No evidence of aortic dissection or aneurysm. 2. Pulmonary artery enlargement suggests pulmonary arterial hypertension. 3. Coronary artery atherosclerosis. Aortic Atherosclerosis (ICD10-I70.0). 4. Moderate proximal left subclavian artery stenosis, chronic. Electronically Signed   By: Jeronimo Greaves M.D.   On: 10/12/2019 09:40   ECHOCARDIOGRAM COMPLETE  Result Date: 10/12/2019    ECHOCARDIOGRAM REPORT   Patient Name:   Ronald Mullins Date of Exam: 10/12/2019 Medical Rec #:  [id number]    Height:       71.0 in Accession #:    [id number]   Weight:       226.1 lb Date of Birth:  09/24/1950   BSA:          2.221 m Patient Age:    68 years     BP:           147/89 mmHg Patient Gender: M            HR:           64 bpm. Exam Location:  Inpatient Procedure: 2D Echo, Cardiac Doppler, Color Doppler and Intracardiac            Opacification Agent Indications:    Chest pain 786.50/R07.9  History:        Patient has prior history of Echocardiogram examinations.                 Angina, CAD and Previous Myocardial Infarction,                 Signs/Symptoms:Chest Pain; Risk Factors:Hypertension, Former                 Smoker and Dyslipidemia. AAA s/p rupture w/repair.  Sonographer:    Ross Ludwig RDCS (AE) Referring Phys: [id number] [facility] IMPRESSIONS  1. Left ventricular ejection fraction, by estimation, is 55 to 60%. The left ventricle has normal function. The left ventricle has no regional wall motion abnormalities. There is mild concentric left ventricular hypertrophy. Left ventricular diastolic parameters are consistent with Grade II  diastolic dysfunction (pseudonormalization).  2. Right ventricular systolic function is normal. The right ventricular size is normal.  3. Left atrial size was mild to moderately dilated.  4. The mitral valve is grossly normal. Trivial mitral valve regurgitation. No evidence of mitral stenosis.  5. The aortic valve is grossly normal. Aortic valve regurgitation is not visualized. No aortic stenosis is present. Comparison(s): No prior Echocardiogram. Conclusion(s)/Recommendation(s): Normal biventricular function without evidence of hemodynamically significant valvular heart disease. FINDINGS  Left Ventricle: Left ventricular ejection fraction, by  estimation, is 55 to 60%. The left ventricle has normal function. The left ventricle has no regional wall motion abnormalities. Definity contrast agent was given IV to delineate the left ventricular  endocardial borders. The left ventricular internal cavity size was normal in size. There is mild concentric left ventricular hypertrophy. Left ventricular diastolic parameters are consistent with Grade II diastolic dysfunction (pseudonormalization). Right Ventricle: The right ventricular size is normal. No increase in right ventricular wall thickness. Right ventricular systolic function is normal. Left Atrium: Left atrial size was mild to moderately dilated. Right Atrium: Right atrial size was normal in size. Pericardium: There is no evidence of pericardial effusion. Mitral Valve: The mitral valve is grossly normal. There is mild thickening of the mitral valve leaflet(s). There is mild calcification of the mitral valve leaflet(s). Mild mitral annular calcification. Trivial mitral valve regurgitation. No evidence of mitral valve stenosis. MV peak gradient, 5.0 mmHg. The mean mitral valve gradient is 1.0 mmHg. Tricuspid Valve: The tricuspid valve is normal in structure. Tricuspid valve regurgitation is trivial. No evidence of tricuspid stenosis. Aortic Valve: The aortic valve is  grossly normal. Aortic valve regurgitation is not visualized. No aortic stenosis is present. There is mild calcification of the aortic valve. Aortic valve mean gradient measures 3.0 mmHg. Aortic valve peak gradient measures 6.5 mmHg. Aortic valve area, by VTI measures 2.56 cm. Pulmonic Valve: The pulmonic valve was grossly normal. Pulmonic valve regurgitation is trivial. No evidence of pulmonic stenosis. Aorta: The aortic root and ascending aorta are structurally normal, with no evidence of dilitation. Venous: The inferior vena cava was not well visualized. IAS/Shunts: No atrial level shunt detected by color flow Doppler.  LEFT VENTRICLE PLAX 2D LVIDd:         4.90 cm  Diastology LVIDs:         3.50 cm  LV e' lateral:   5.44 cm/s LV PW:         1.50 cm  LV E/e' lateral: 13.8 LV IVS:        1.50 cm  LV e' medial:    5.55 cm/s LVOT diam:     2.00 cm  LV E/e' medial:  13.5 LV SV:         74 LV SV Index:   34 LVOT Area:     3.14 cm  RIGHT VENTRICLE RV Basal diam:  3.00 cm RV S prime:     13.40 cm/s TAPSE (M-mode): 2.0 cm LEFT ATRIUM             Index       RIGHT ATRIUM           Index LA diam:        3.20 cm 1.44 cm/m  RA Area:     15.80 cm LA Vol (A2C):   84.8 ml 38.17 ml/m RA Volume:   38.20 ml  17.20 ml/m LA Vol (A4C):   86.7 ml 39.03 ml/m LA Biplane Vol: 85.8 ml 38.62 ml/m  AORTIC VALVE AV Area (Vmax):    2.33 cm AV Area (Vmean):   2.18 cm AV Area (VTI):     2.56 cm AV Vmax:           127.00 cm/s AV Vmean:          88.500 cm/s AV VTI:            0.291 m AV Peak Grad:      6.5 mmHg AV Mean Grad:      3.0 mmHg LVOT  Vmax:         94.10 cm/s LVOT Vmean:        61.400 cm/s LVOT VTI:          0.237 m LVOT/AV VTI ratio: 0.81  AORTA Ao Root diam: 3.30 cm Ao Asc diam:  3.50 cm MITRAL VALVE MV Area (PHT): 2.69 cm    SHUNTS MV Peak grad:  5.0 mmHg    Systemic VTI:  0.24 m MV Mean grad:  1.0 mmHg    Systemic Diam: 2.00 cm MV Vmax:       1.12 m/s MV Vmean:      51.0 cm/s MV Decel Time: 282 msec MV E velocity: 75.20  cm/s MV A velocity: 87.10 cm/s MV E/A ratio:  0.86 Buford Dresser MD Electronically signed by Buford Dresser MD Signature Date/Time: 10/12/2019/3:24:01 PM    Final     Cardiac Studies    Cath 01/29/16:  Prior second diagonal stent is widely patent.  Mid LAD lesion, 50 %stenosed.  Dist Cx-1 lesion, 20 %stenosed.  Distal Cx stent is widely patent.  2nd Mrg-1 lesion, 20 %stenosed.  OM2 stent is widely patent.  The left ventricular systolic function is normal.  LV end diastolic pressure is normal.  The left ventricular ejection fraction is 55-65% by visual estimate.  Plan: 1. Continue dual antiplatelet therapy and aggressive secondary prevention. 2. Medical therapy for atypical chest pain with non-obstructive coronary artery disease.  Echo 10/12/19 1. Left ventricular ejection fraction, by estimation, is 55 to 60%. The  left ventricle has normal function. The left ventricle has no regional  wall motion abnormalities. There is mild concentric left ventricular  hypertrophy. Left ventricular diastolic  parameters are consistent with Grade II diastolic dysfunction  (pseudonormalization).  2. Right ventricular systolic function is normal. The right ventricular  size is normal.  3. Left atrial size was mild to moderately dilated.  4. The mitral valve is grossly normal. Trivial mitral valve  regurgitation. No evidence of mitral stenosis.  5. The aortic valve is grossly normal. Aortic valve regurgitation is not  visualized. No aortic stenosis is present.   Patient Profile     69 y.o. male  with a hx ofCAD s/p STEMI and PCI in 2016, AAA s/p rupture in 2015 w/ endovascular repair, HTN, HLDwho presents with chest pain  Assessment & Plan    Chest pain/CAD s/p multiple stents and STEMI in 2016 - Last cath in 2017 showed patent D2 stent patent distal LCx stent, patent OM2 stent, normal LV systolic function - Presented with chest pressure for about an hour -  Hs trop 16>14. Chest CTA with no aortic dissection - Aspirin - Atorvastatin - amlodipine - Echo with EF 55-60%, no RWMA, G2DD, moderately dilated LA, trivial MR - Stress test today. Patient had minimal 3/10 chest discomfort during test which quickly resolved. Results pending  For questions or updates, please contact [place] Please consult [email or web address] for contact info under     Signed, Cadence Ninfa Meeker, PA-C  10/13/2019, 9:57 AM    Patient seen and examined.  Agree with above documentation.  Lexiscan Myoview today shows anterior infarct but no ischemia.  LVEF 39% on Myoview, but notably was normal on TTE yesterday.  Patient has remained chest pain-free.  On exam, patient is alert and oriented, regular rate and rhythm, no murmurs, lungs CTAB, no LE edema or JVD.  Given stress test negative for ischemia, no further work-up recommended.  OK to discharge from cardiac standpoint.  He  will follow-up with his cardiologist in [facility]  Little Ishikawa, MD

## 2019-10-13 NOTE — Progress Notes (Signed)
  Subjective:  Patient seen at bedside. Patient denies chest pain, shortness of breath. Patient denies history of exertional shortness of breath but states he does not exercise much.  Objective:    Vital Signs (last 24 hours): Vitals:   10/12/19 2011 10/13/19 0058 10/13/19 0317 10/13/19 0625  BP: (!) 157/68 (!) 157/65 (!) 147/65 (!) 145/68  Pulse: 64 (!) 56 62 65  Resp: 20 18 18 20   Temp: 98.2 F (36.8 C) 98.2 F (36.8 C) 98.1 F (36.7 C) 98.1 F (36.7 C)  TempSrc: Oral Oral Oral Oral  SpO2: 98% 98% 98% 98%  Weight:    102 kg  Height:        Physical Exam: General Alert and answers questions appropriately, no acute distress  Cardiac Regular rate and rhythm, no murmurs, rubs, or gallops  Pulmonary Clear to auscultation bilaterally without wheezes, rhonchi, or rales    CBC Latest Ref Rng & Units 10/13/2019 10/12/2019 10/11/2019  WBC 4.0 - 10.5 K/uL 8.6 7.0 8.8  Hemoglobin 13.0 - 17.0 g/dL 10/13/2019 93.9 03.0  Hematocrit 39.0 - 52.0 % 45.9 45.5 45.5  Platelets 150 - 400 K/uL 253 242 268   BMP Latest Ref Rng & Units 10/13/2019 10/12/2019 10/11/2019  Glucose 70 - 99 mg/dL 10/13/2019) 83 330(Q)  BUN 8 - 23 mg/dL 17 13 15   Creatinine 0.61 - 1.24 mg/dL 762(U) ) 6.33(H)  Sodium 135 - 145 mmol/L 138 139 141  Potassium 3.5 - 5.1 mmol/L 4.0 4.4 3.9  Chloride 98 - 111 mmol/L 106 105 106  CO2 22 - 32 mmol/L 24 28 24   Calcium 8.9 - 10.3 mg/dL 9.7 9.4 9.7    Assessment/Plan:   Principal Problem:   Chest pain Active Problems:   Essential hypertension   Coronary arteriosclerosis due to lipid rich plaque  Patient is a 69 year old male with past medical history significant for coronary artery disease status post PCI, hypertension, hyperlipidemia, ruptured AAA status post repair admitted for atypical chest pain on 10/11/2019.  # Atypical chest pain Patient presented with acute onset substernal chest pain relieved by nitroglycerin at rest. Prior history of MI and CAD s/p PCI. Troponins < 20  x2. EKG without ischemic changes. CTA without evidence for dissection. Cardiology on board. Echocardiogram yesterday reveals EF 55-60%, no regional wall motion abnormalities, grade 2 diastolic dysfunction. Currently vital signs stable, chest pain free *Plan for pharmacologic stress test per cardiology today *Continue with home meds of aspirin 81 mg daily, atorvastatin 80 mg daily, nitroglycerin 0.4 mg PRN *Followup cardiology recommendations  # HTN:  AM BP of 145/68, on amlodipine and metoprolol at home, amlodipine dose increased per cardiology *Amlodipine 10 mg daily *Holding metoprolol, heart rate has improved, will followup cardiology recommendations  DVT prophx: Lovenox 40 mg daily Diet: NPO pending stress test Code: Full Dispo: Anticipated discharge 0-1 days  , MD 10/13/2019, 7:17 AM

## 2019-10-13 NOTE — Hospital Course (Signed)
#   Atypical chest pain Patient is a 69 year old male with past medical history significant for coronary artery disease status post PCI, hypertension, hyperlipidemia, ruptured AAA status post repair admitted for atypical chest pain on 10/11/2019. Patient presented with < 24 hours of left-sided severe chest pain occurring at rest, improved with nitroglycerin. Cardiology was consulted and patient admitted. Troponins were flat at 16 and 14, EKG without evidence for acute ischemia, TTE revealed EF 55-60%, no regional wall motion abnormalities, and grade 2 diastolic dysfunction. Pharmacologic stress test was performed and revealed ***

## 2019-10-13 NOTE — Discharge Summary (Signed)
Name: Ronald Mullins MRN: [id number] DOB: Mar 15, 1951 69 y.o. PCP: [facility]  Date of Admission: 10/11/2019 10:52 AM Date of Discharge: 10/13/2019 Attending Physician: Erlinda Hong, MD FACP  Discharge Diagnosis: 1. Chest pain 2. Hypertension  Discharge Medications: Allergies as of 10/13/2019   No Known Allergies     Medication List    STOP taking these medications   metoprolol succinate 50 MG 24 hr tablet Commonly known as: TOPROL-XL     TAKE these medications   amLODipine 5 MG tablet Commonly known as: NORVASC Take 5 mg by mouth daily.   aspirin 81 MG tablet Take 81 mg by mouth daily.   atorvastatin 80 MG tablet Commonly known as: LIPITOR Take 80 mg by mouth daily at 6 PM.   nitroGLYCERIN 0.4 MG SL tablet Commonly known as: NITROSTAT Place 1 tablet (0.4 mg total) under the tongue every 5 (five) minutes as needed for chest pain.   pantoprazole 40 MG tablet Commonly known as: PROTONIX Take 1 tablet (40 mg total) by mouth daily.       Disposition and follow-up:   Mr.Ronald Mullins was discharged from [facility] in Stable condition.  At the hospital follow up visit please address:  1.  Please consider restarting patient's metoprolol. It was held during hospitalization for bradycardia. Please assess patient for recurrent chest pain and ensure patient has followed up with cardiology.  2.  Labs / imaging needed at time of follow-up: None  3.  Pending labs/ test needing follow-up: None  Follow-up Appointments: Follow-up Information    [facility] Follow up.   Why: Please call for a followup appointment to occur within the next week Contact information: [address] [place] [address] [phone]        Nahser, Deloris Ping, MD Follow up.   Specialty: Cardiology Why: Please call for a followup appointment Contact information: [address] [place] [address] [phone]            Hospital Course by problem list:  # Chest pain Patient is a 69 year old male with past medical history significant for coronary artery disease status post PCI (2016), hypertension, hyperlipidemia, ruptured AAA status post repair admitted for chest pain on 10/11/2019. Patient presented with < 24 hours of left-sided severe chest pain occurring at rest, improved with nitroglycerin. Cardiology was consulted and patient admitted. Troponins were flat at 16 and 14, EKG without evidence for acute ischemia, CTA without evidence for aortic dissection, TTE revealed EF 55-60%, no regional wall motion abnormalities, and grade 2 diastolic dysfunction. 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 Patient was discharged with instructions for PCP and cardiology followup.  Discharge Vitals:   BP (!) 167/83   Pulse 65   Temp 98.1 F (36.7 C) (Oral)   Resp 20   Ht 5\' 11"  (1.803 m)   Wt 102 kg   SpO2 98%   BMI 31.36 kg/m   Pertinent Labs, Studies, and Procedures:  CBC Latest Ref Rng & Units 10/13/2019 10/12/2019 10/11/2019  WBC 4.0 - 10.5 K/uL 8.6 7.0 8.8  Hemoglobin 13.0 - 17.0 g/dL 10/13/2019 22.6 33.3  Hematocrit 39.0 - 52.0 % 45.9 45.5 45.5  Platelets 150 - 400 K/uL 253 242 268   BMP Latest Ref Rng & Units 10/13/2019 10/12/2019 10/11/2019  Glucose 70 - 99 mg/dL 10/13/2019) 83 625(W)  BUN 8 - 23 mg/dL 17 13 15   Creatinine 0.61 - 1.24 mg/dL 389(H)  1.37(H) 1.34(H)  Sodium 135 - 145 mmol/L 138 139 141  Potassium 3.5 - 5.1 mmol/L 4.0 4.4 3.9  Chloride 98 - 111 mmol/L 106 105 106  CO2 22 - 32 mmol/L 24 28 24   Calcium 8.9 - 10.3 mg/dL 9.7 9.4 9.7   Lexiscan Myoview Study (10/13/19): IMPRESSION: 1. There is a large (15-20% of LV), moderate to severe, fixed perfusion defect present in the apical septum, apical anterior, and apical segments consistent with prior infarction.  2. No ischemia.  3. LVEF 39% with global HK, but echo demonstrated normal LVEF.  4. Intermediate  risk study.   CTA Chest Aorta (10/12/19): IMPRESSION: 1. No evidence of aortic dissection or aneurysm. 2. Pulmonary artery enlargement suggests pulmonary arterial hypertension. 3. Coronary artery atherosclerosis. Aortic Atherosclerosis (ICD10-I70.0). 4. Moderate proximal left subclavian artery stenosis, chronic.  TTE (10/12/19): IMPRESSION: 1. Left ventricular ejection fraction, by estimation, is 55 to 60%. The  left ventricle has normal function. The left ventricle has no regional  wall motion abnormalities. There is mild concentric left ventricular  hypertrophy. Left ventricular diastolic  parameters are consistent with Grade II diastolic dysfunction  (pseudonormalization).  2. Right ventricular systolic function is normal. The right ventricular  size is normal.  3. Left atrial size was mild to moderately dilated.  4. The mitral valve is grossly normal. Trivial mitral valve  regurgitation. No evidence of mitral stenosis.  5. The aortic valve is grossly normal. Aortic valve regurgitation is not  visualized. No aortic stenosis is present.   Left Heart Cath (01/29/2016 - From Prior Hospitalization):  Prior second diagonal stent is widely patent.  Mid LAD lesion, 50 %stenosed.  Dist Cx-1 lesion, 20 %stenosed.  Distal Cx stent is widely patent.  2nd Mrg-1 lesion, 20 %stenosed.  OM2 stent is widely patent.  The left ventricular systolic function is normal.  LV end diastolic pressure is normal.  The left ventricular ejection fraction is 55-65% by visual estimate.  Discharge Instructions: Discharge Instructions    Call MD for:  difficulty breathing, headache or visual disturbances   Complete by: As directed    Call MD for:  persistant dizziness or light-headedness   Complete by: As directed    Call MD for:  severe uncontrolled pain   Complete by: As directed    Diet - low sodium heart healthy   Complete by: As directed    Discharge instructions   Complete by: As  directed    You were seen in the hospital for chest pain. A stress test was done which did not show evidence for a new heart attack. Please followup with your outpatient primary care provider and your cardiologist. Please do not take your metoprolol until you followup with your doctor. They may decide to restart this medication. Please take all your other medications as prescribed.   Thank you for allowing [place] to be part of your medical care!   Increase activity slowly   Complete by: As directed      Signed: Jeanmarie Hubert, MD 10/13/2019, 4:59 PM

## 2021-07-21 IMAGING — DX DG CHEST 2V
2 series · 2 of 2 positions shown · non-contrast
Comparison: 09/24/2017

CLINICAL DATA: Chest pain

EXAM:
CHEST - 2 VIEW

[w chest pa]
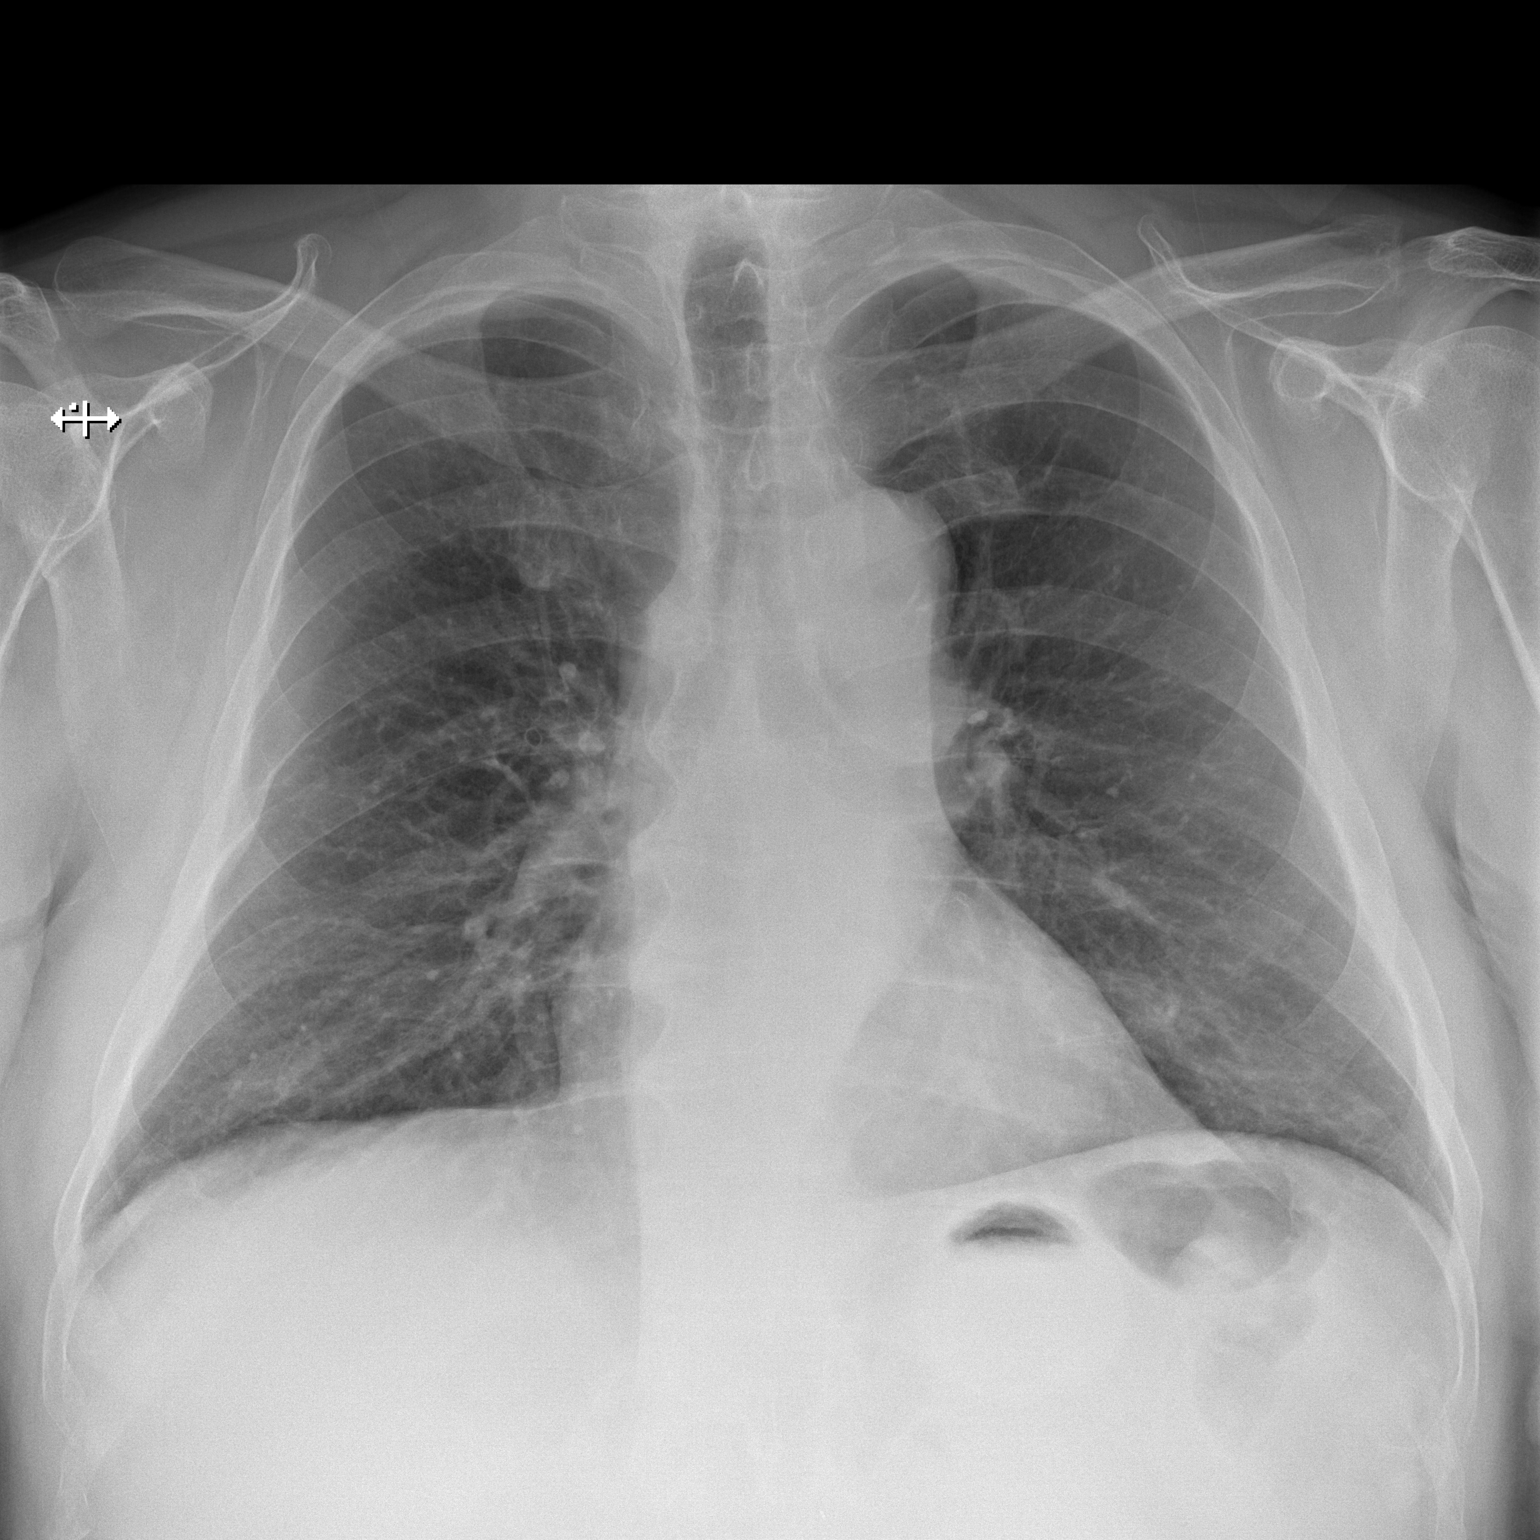

[w chest lat]
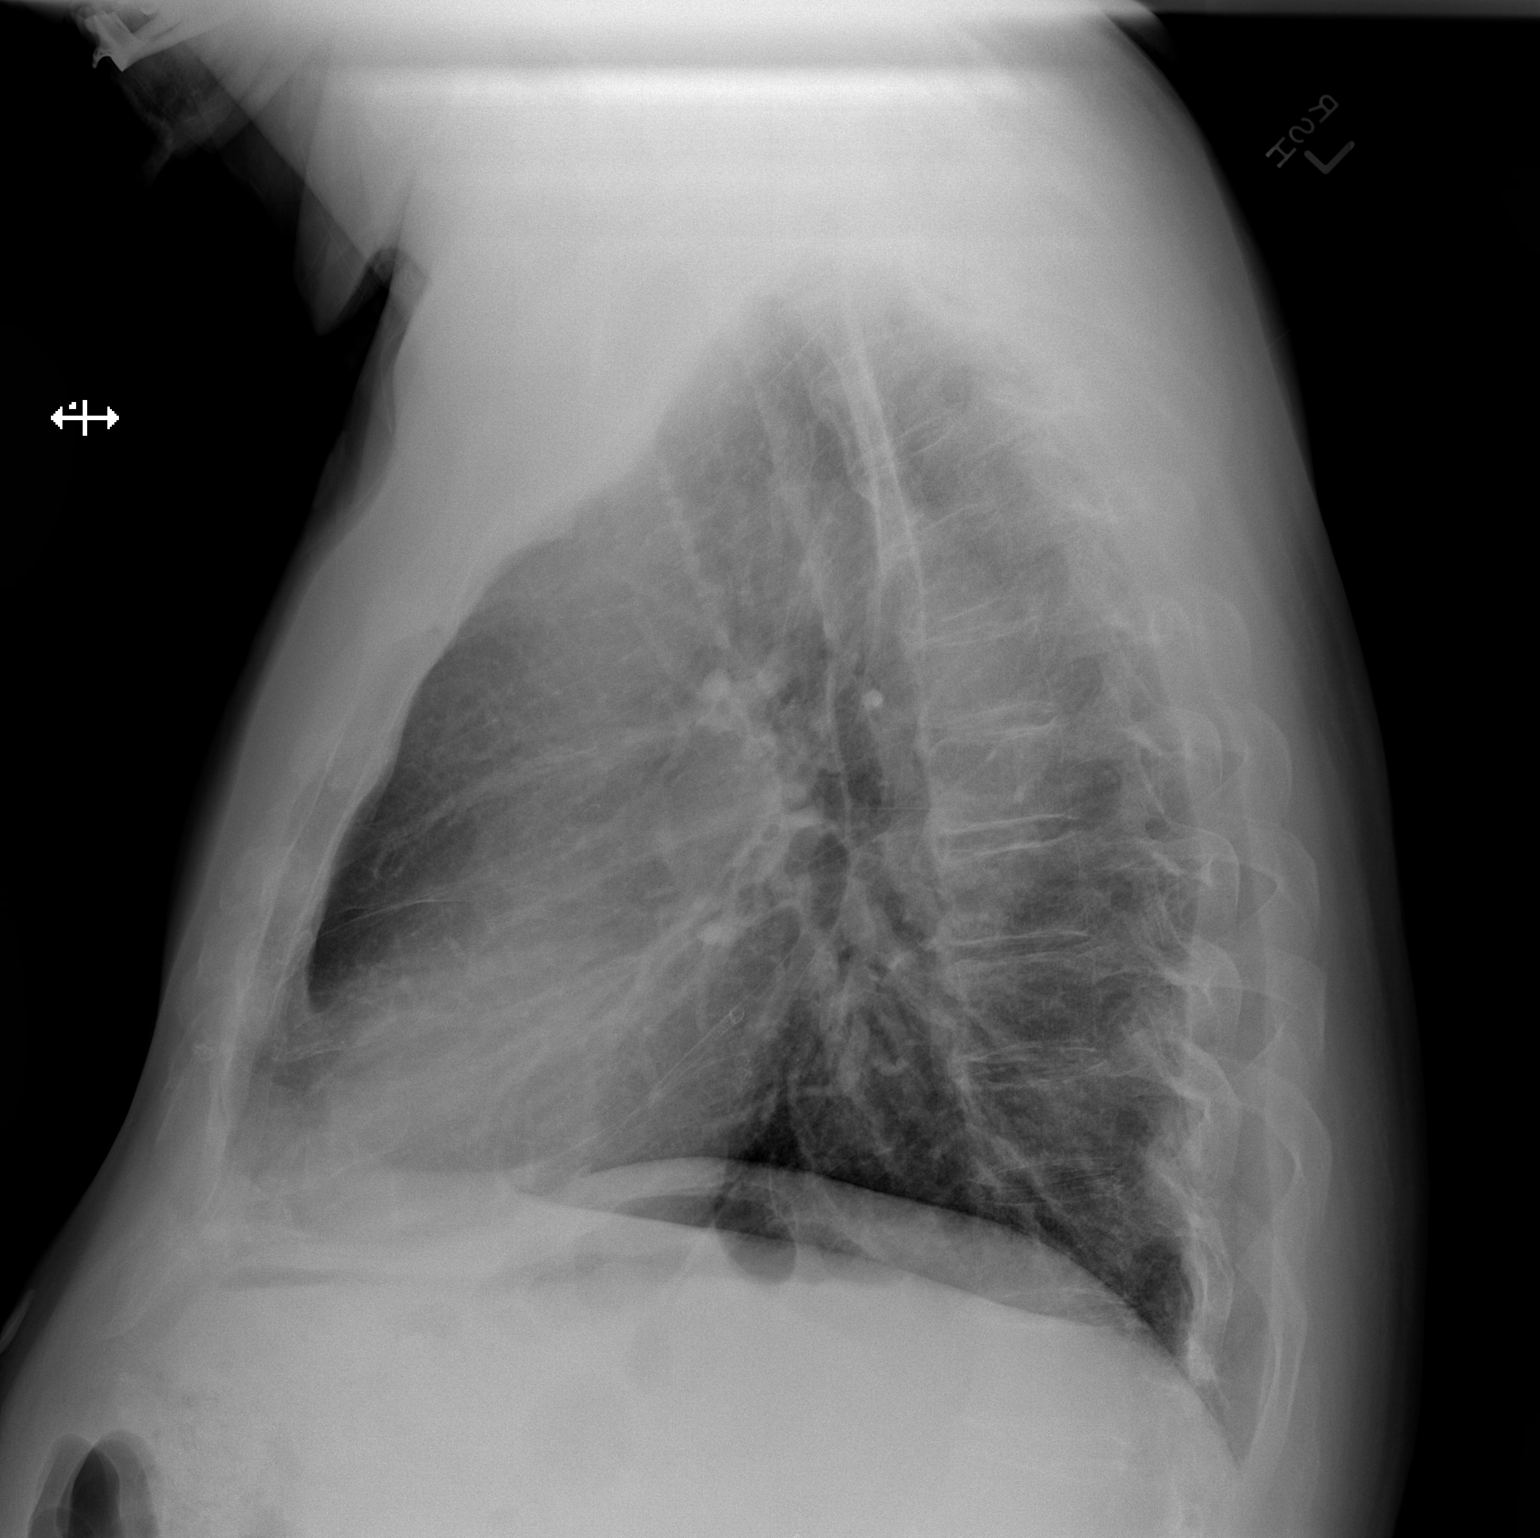

[2 of 2 positions shown; findings below may reference images not displayed]

FINDINGS: The heart size and mediastinal contours are within normal limits.
Minimal atherosclerotic calcification of the aortic knob. No focal
airspace consolidation, pleural effusion, or pneumothorax. The
visualized skeletal structures are unremarkable.
IMPRESSION: No active cardiopulmonary disease.
